# Patient Record
Sex: Female | Born: 1990 | Race: Black or African American | Hispanic: No | Marital: Single | Smoking: Never smoker
Health system: Southern US, Community
[De-identification: ages and names within clinical notes are randomized; demographics above are authoritative.]

## PROBLEM LIST (undated history)

## (undated) DIAGNOSIS — O24419 Gestational diabetes mellitus in pregnancy, unspecified control: Secondary | ICD-10-CM

## (undated) DIAGNOSIS — Z789 Other specified health status: Secondary | ICD-10-CM

## (undated) HISTORY — PX: NO PAST SURGERIES: SHX2092

---

## 2014-05-07 LAB — OB RESULTS CONSOLE GC/CHLAMYDIA
CHLAMYDIA, DNA PROBE: NEGATIVE
Gonorrhea: NEGATIVE

## 2014-06-18 LAB — OB RESULTS CONSOLE ANTIBODY SCREEN: ANTIBODY SCREEN: NEGATIVE

## 2014-06-18 LAB — OB RESULTS CONSOLE ABO/RH: RH Type: POSITIVE

## 2014-06-18 LAB — OB RESULTS CONSOLE RUBELLA ANTIBODY, IGM: RUBELLA: IMMUNE

## 2014-06-18 LAB — OB RESULTS CONSOLE HEPATITIS B SURFACE ANTIGEN: HEP B S AG: NEGATIVE

## 2014-06-18 LAB — OB RESULTS CONSOLE HIV ANTIBODY (ROUTINE TESTING): HIV: NONREACTIVE

## 2014-06-18 LAB — OB RESULTS CONSOLE RPR: RPR: NONREACTIVE

## 2014-11-14 LAB — OB RESULTS CONSOLE GBS: GBS: NEGATIVE

## 2014-12-01 ENCOUNTER — Encounter (HOSPITAL_COMMUNITY): Payer: Self-pay | Admitting: *Deleted

## 2014-12-01 ENCOUNTER — Inpatient Hospital Stay (HOSPITAL_COMMUNITY)
Admission: AD | Admit: 2014-12-01 | Discharge: 2014-12-01 | Disposition: A | Discharge status: Home / Self Care | Payer: Medicaid Other | Source: Ambulatory Visit | Attending: Obstetrics and Gynecology | Admitting: Obstetrics and Gynecology

## 2014-12-01 DIAGNOSIS — O133 Gestational [pregnancy-induced] hypertension without significant proteinuria, third trimester: Secondary | ICD-10-CM | POA: Diagnosis present

## 2014-12-01 DIAGNOSIS — Z3A38 38 weeks gestation of pregnancy: Secondary | ICD-10-CM | POA: Insufficient documentation

## 2014-12-01 DIAGNOSIS — O471 False labor at or after 37 completed weeks of gestation: Secondary | ICD-10-CM | POA: Diagnosis not present

## 2014-12-01 LAB — CBC
HCT: 31.6 % — ABNORMAL LOW (ref 36.0–46.0)
Hemoglobin: 10.5 g/dL — ABNORMAL LOW (ref 12.0–15.0)
MCH: 29 pg (ref 26.0–34.0)
MCHC: 33.2 g/dL (ref 30.0–36.0)
MCV: 87.3 fL (ref 78.0–100.0)
PLATELETS: 236 10*3/uL (ref 150–400)
RBC: 3.62 MIL/uL — AB (ref 3.87–5.11)
RDW: 14.2 % (ref 11.5–15.5)
WBC: 9 10*3/uL (ref 4.0–10.5)

## 2014-12-01 LAB — URINALYSIS, ROUTINE W REFLEX MICROSCOPIC
BILIRUBIN URINE: NEGATIVE
Glucose, UA: NEGATIVE mg/dL
KETONES UR: NEGATIVE mg/dL
NITRITE: NEGATIVE
Protein, ur: NEGATIVE mg/dL
SPECIFIC GRAVITY, URINE: 1.01 (ref 1.005–1.030)
UROBILINOGEN UA: 0.2 mg/dL (ref 0.0–1.0)
pH: 6 (ref 5.0–8.0)

## 2014-12-01 LAB — COMPREHENSIVE METABOLIC PANEL
ALT: 11 U/L — AB (ref 14–54)
ANION GAP: 9 (ref 5–15)
AST: 18 U/L (ref 15–41)
Albumin: 2.9 g/dL — ABNORMAL LOW (ref 3.5–5.0)
Alkaline Phosphatase: 99 U/L (ref 38–126)
BUN: 10 mg/dL (ref 6–20)
CHLORIDE: 106 mmol/L (ref 101–111)
CO2: 20 mmol/L — AB (ref 22–32)
Calcium: 9.5 mg/dL (ref 8.9–10.3)
Creatinine, Ser: 0.9 mg/dL (ref 0.44–1.00)
Glucose, Bld: 81 mg/dL (ref 65–99)
POTASSIUM: 3.7 mmol/L (ref 3.5–5.1)
SODIUM: 135 mmol/L (ref 135–145)
Total Bilirubin: 0.3 mg/dL (ref 0.3–1.2)
Total Protein: 7 g/dL (ref 6.5–8.1)

## 2014-12-01 LAB — PROTEIN / CREATININE RATIO, URINE
CREATININE, URINE: 77 mg/dL
PROTEIN CREATININE RATIO: 0.14 mg/mg{creat} (ref 0.00–0.15)
TOTAL PROTEIN, URINE: 11 mg/dL

## 2014-12-01 LAB — URINE MICROSCOPIC-ADD ON

## 2014-12-01 NOTE — MAU Provider Note (Signed)
Chief Complaint:  Labor Eval  First Provider Initiated Contact with Patient 12/01/14 1120      HPI: Charlene Petty is a 24 y.o. G1P0 at [redacted]w[redacted]d who presents to maternity admissions reporting for term labor check. Elevated BP noted by RN.  No associated HA, vision changes or epigastric pain. leakage of fluid or vaginal bleeding. Good fetal movement.   Pregnancy Course: Uncomplicated except for elevated 1 hour GTT, vomited 3 hour GTT. Checking CBG which are normal. No prior elevated BPs per prenatal record.   Past Medical History: No pertinent positive medical history  Past obstetric history: OB History  Gravida Para Term Preterm AB SAB TAB Ectopic Multiple Living  1             # Outcome Date GA Lbr Len/2nd Weight Sex Delivery Anes PTL Lv  1 Current               Past Surgical History: No GYN surgeries   Family History: Breast cancer in grandmother at age 31  Social History: Social History  Substance Use Topics  . Smoking status: Not on file  . Smokeless tobacco: Not on file  . Alcohol Use: Not on file    Allergies: No Known Allergies  Meds:  No prescriptions prior to admission    I have reviewed patient's Past Medical Hx, Surgical Hx, Family Hx, Social Hx, medications and allergies.   ROS:  Review of Systems  Eyes: Negative for visual disturbance.  Gastrointestinal: Positive for abdominal pain (mild contractions only).  Genitourinary: Negative for vaginal bleeding.       Negative for leaking of fluid.  Neurological: Negative for headaches.    Physical Exam   Patient Vitals for the past 24 hrs:  BP Pulse Resp  12/01/14 1356 134/87 mmHg 92 18  12/01/14 1347 134/87 mmHg 92 -  12/01/14 1330 138/77 mmHg 91 -  12/01/14 1315 118/59 mmHg 99 -  12/01/14 1300 123/88 mmHg 91 -  12/01/14 1245 126/80 mmHg 100 -  12/01/14 1230 121/91 mmHg 96 -  12/01/14 1215 120/70 mmHg 96 -  12/01/14 1200 123/55 mmHg 94 -  12/01/14 1146 131/68 mmHg 94 -  12/01/14 1130 135/87 mmHg  88 -  12/01/14 1116 138/81 mmHg 96 -  12/01/14 1112 146/84 mmHg 92 -  12/01/14 1058 142/84 mmHg 95 -  12/01/14 1053 132/68 mmHg 86 -  12/01/14 0953 140/82 mmHg 95 -   Constitutional: Well-developed, well-nourished female in no acute distress.  Cardiovascular: normal rate Respiratory: normal effort GI: Abd gravid appropriate for gestational age.  MS: Extremities nontender, 1+ edema. Neurologic: Alert and oriented x 4.  ZO:XWRUEAVW: 2.5 Effacement (%): 70 Station: -2 Presentation: Undeterminable Exam by:: Ginger Morris RN  FHT:  Baseline 140 , moderate variability, accelerations present, no decelerations Contractions: Uterine irritability   Labs: Results for orders placed or performed during the hospital encounter of 12/01/14 (from the past 24 hour(s))  Protein / creatinine ratio, urine     Status: None   Collection Time: 12/01/14 11:00 AM  Result Value Ref Range   Creatinine, Urine 77.00 mg/dL   Total Protein, Urine 11 mg/dL   Protein Creatinine Ratio 0.14 0.00 - 0.15 mg/mg[Cre]  Urinalysis, Routine w reflex microscopic (not at [facility])     Status: Abnormal   Collection Time: 12/01/14 11:00 AM  Result Value Ref Range   Color, Urine YELLOW YELLOW   APPearance CLEAR CLEAR   Specific Gravity, Urine 1.010 1.005 - 1.030   pH 6.0 5.0 -  8.0   Glucose, UA NEGATIVE NEGATIVE mg/dL   Hgb urine dipstick TRACE (A) NEGATIVE   Bilirubin Urine NEGATIVE NEGATIVE   Ketones, ur NEGATIVE NEGATIVE mg/dL   Protein, ur NEGATIVE NEGATIVE mg/dL   Urobilinogen, UA 0.2 0.0 - 1.0 mg/dL   Nitrite NEGATIVE NEGATIVE   Leukocytes, UA SMALL (A) NEGATIVE  Urine microscopic-add on     Status: Abnormal   Collection Time: 12/01/14 11:00 AM  Result Value Ref Range   Squamous Epithelial / LPF FEW (A) RARE   WBC, UA 3-6 <3 WBC/hpf   RBC / HPF 0-2 <3 RBC/hpf   Bacteria, UA FEW (A) RARE  CBC     Status: Abnormal   Collection Time: 12/01/14 11:07 AM  Result Value Ref Range   WBC 9.0 4.0 - 10.5 K/uL   RBC  3.62 (L) 3.87 - 5.11 MIL/uL   Hemoglobin 10.5 (L) 12.0 - 15.0 g/dL   HCT 40.931.6 (L) 81.136.0 - 91.446.0 %   MCV 87.3 78.0 - 100.0 fL   MCH 29.0 26.0 - 34.0 pg   MCHC 33.2 30.0 - 36.0 g/dL   RDW 78.214.2 95.611.5 - 21.315.5 %   Platelets 236 150 - 400 K/uL  Comprehensive metabolic panel     Status: Abnormal   Collection Time: 12/01/14 11:07 AM  Result Value Ref Range   Sodium 135 135 - 145 mmol/L   Potassium 3.7 3.5 - 5.1 mmol/L   Chloride 106 101 - 111 mmol/L   CO2 20 (L) 22 - 32 mmol/L   Glucose, Bld 81 65 - 99 mg/dL   BUN 10 6 - 20 mg/dL   Creatinine, Ser 0.860.90 0.44 - 1.00 mg/dL   Calcium 9.5 8.9 - 57.810.3 mg/dL   Total Protein 7.0 6.5 - 8.1 g/dL   Albumin 2.9 (L) 3.5 - 5.0 g/dL   AST 18 15 - 41 U/L   ALT 11 (L) 14 - 54 U/L   Alkaline Phosphatase 99 38 - 126 U/L   Total Bilirubin 0.3 0.3 - 1.2 mg/dL   GFR calc non Af Amer >60 >60 mL/min   GFR calc Af Amer >60 >60 mL/min   Anion gap 9 5 - 15    Imaging:  No results found.  MAU Course: CBC, CMP, UA, protein creatinine ratio, NST, cycle blood pressures.  MDM:  24 year old female 30 weeks 1 day with transient hypertension of pregnancy, but no evidence of preeclampsia.   Assessment: 1. Transient hypertension of pregnancy in third trimester   2. False labor after 37 weeks of gestation without delivery    Plan: Discharge home in stable condition.  Labor precautions and fetal kick counts  preeclampsia precautions.  Follow-up Information    Follow up with Valisa Karpel A, MD.   Specialty:  Obstetrics and Gynecology   Why:  This week as scheduled or sooner as needed if symptoms worsen   Contact information:   [address] [place] [id number] [phone]       [facility].   Why:  As needed for labor or symptoms of preeclampsia   Contact information:   [address] [id number] [place] [id number] [phone]        Medication  List    TAKE these medications        prenatal multivitamin Tabs tablet  Take 1 tablet by mouth daily at 12 noon.       Cabin JohnVirginia Smith, CNM 12/01/2014 5:56 PM

## 2014-12-01 NOTE — Progress Notes (Signed)
Dr Henderson CloudHorvath notified of pt's FHR, contraction pattern, VE, orders received to discharge home

## 2014-12-01 NOTE — MAU Note (Signed)
Patient presents at 5138 weeks gestation with c/o contractions X 3 days. Fetus active. Denies bleeding but has a white discharge.

## 2014-12-01 NOTE — Discharge Instructions (Signed)
Fetal Movement Counts °Patient Name: __________________________________________________ Patient Due Date: ____________________ °Performing a fetal movement count is highly recommended in high-risk pregnancies, but it is good for every pregnant woman to do. Your health care provider may ask you to start counting fetal movements at 28 weeks of the pregnancy. Fetal movements often increase: °· After eating a full meal. °· After physical activity. °· After eating or drinking something sweet or cold. °· At rest. °Pay attention to when you feel the baby is most active. This will help you notice a pattern of your baby's sleep and wake cycles and what factors contribute to an increase in fetal movement. It is important to perform a fetal movement count at the same time each day when your baby is normally most active.  °HOW TO COUNT FETAL MOVEMENTS °· Find a quiet and comfortable area to sit or lie down on your left side. Lying on your left side provides the best blood and oxygen circulation to your baby. °· Write down the day and time on a sheet of paper or in a journal. °· Start counting kicks, flutters, swishes, rolls, or jabs in a 2-hour period. You should feel at least 10 movements within 2 hours. °· If you do not feel 10 movements in 2 hours, wait 2-3 hours and count again. Look for a change in the pattern or not enough counts in 2 hours. °SEEK MEDICAL CARE IF: °· You feel less than 10 counts in 2 hours, tried twice. °· There is no movement in over an hour. °· The pattern is changing or taking longer each day to reach 10 counts in 2 hours. °· You feel the baby is not moving as he or she usually does. °Date: ____________ Movements: ____________ Start time: ____________ Finish time: ____________  °Date: ____________ Movements: ____________ Start time: ____________ Finish time: ____________ °Date: ____________ Movements: ____________ Start time: ____________ Finish time: ____________ °Date: ____________ Movements:  ____________ Start time: ____________ Finish time: ____________ °Date: ____________ Movements: ____________ Start time: ____________ Finish time: ____________ °Date: ____________ Movements: ____________ Start time: ____________ Finish time: ____________ °Date: ____________ Movements: ____________ Start time: ____________ Finish time: ____________ °Date: ____________ Movements: ____________ Start time: ____________ Finish time: ____________  °Date: ____________ Movements: ____________ Start time: ____________ Finish time: ____________ °Date: ____________ Movements: ____________ Start time: ____________ Finish time: ____________ °Date: ____________ Movements: ____________ Start time: ____________ Finish time: ____________ °Date: ____________ Movements: ____________ Start time: ____________ Finish time: ____________ °Date: ____________ Movements: ____________ Start time: ____________ Finish time: ____________ °Date: ____________ Movements: ____________ Start time: ____________ Finish time: ____________ °Date: ____________ Movements: ____________ Start time: ____________ Finish time: ____________  °Date: ____________ Movements: ____________ Start time: ____________ Finish time: ____________ °Date: ____________ Movements: ____________ Start time: ____________ Finish time: ____________ °Date: ____________ Movements: ____________ Start time: ____________ Finish time: ____________ °Date: ____________ Movements: ____________ Start time: ____________ Finish time: ____________ °Date: ____________ Movements: ____________ Start time: ____________ Finish time: ____________ °Date: ____________ Movements: ____________ Start time: ____________ Finish time: ____________ °Date: ____________ Movements: ____________ Start time: ____________ Finish time: ____________  °Date: ____________ Movements: ____________ Start time: ____________ Finish time: ____________ °Date: ____________ Movements: ____________ Start time: ____________ Finish  time: ____________ °Date: ____________ Movements: ____________ Start time: ____________ Finish time: ____________ °Date: ____________ Movements: ____________ Start time: ____________ Finish time: ____________ °Date: ____________ Movements: ____________ Start time: ____________ Finish time: ____________ °Date: ____________ Movements: ____________ Start time: ____________ Finish time: ____________ °Date: ____________ Movements: ____________ Start time: ____________ Finish time: ____________  °Date: ____________ Movements: ____________ Start time: ____________ Finish   time: ____________ Date: ____________ Movements: ____________ Start time: ____________ Doreatha Martin time: ____________ Date: ____________ Movements: ____________ Start time: ____________ Doreatha Martin time: ____________ Date: ____________ Movements: ____________ Start time: ____________ Doreatha Martin time: ____________ Date: ____________ Movements: ____________ Start time: ____________ Doreatha Martin time: ____________ Date: ____________ Movements: ____________ Start time: ____________ Doreatha Martin time: ____________ Date: ____________ Movements: ____________ Start time: ____________ Doreatha Martin time: ____________  Date: ____________ Movements: ____________ Start time: ____________ Doreatha Martin time: ____________ Date: ____________ Movements: ____________ Start time: ____________ Doreatha Martin time: ____________ Date: ____________ Movements: ____________ Start time: ____________ Doreatha Martin time: ____________ Date: ____________ Movements: ____________ Start time: ____________ Doreatha Martin time: ____________ Date: ____________ Movements: ____________ Start time: ____________ Doreatha Martin time: ____________ Date: ____________ Movements: ____________ Start time: ____________ Doreatha Martin time: ____________ Date: ____________ Movements: ____________ Start time: ____________ Doreatha Martin time: ____________  Date: ____________ Movements: ____________ Start time: ____________ Doreatha Martin time: ____________ Date: ____________  Movements: ____________ Start time: ____________ Doreatha Martin time: ____________ Date: ____________ Movements: ____________ Start time: ____________ Doreatha Martin time: ____________ Date: ____________ Movements: ____________ Start time: ____________ Doreatha Martin time: ____________ Date: ____________ Movements: ____________ Start time: ____________ Doreatha Martin time: ____________ Date: ____________ Movements: ____________ Start time: ____________ Doreatha Martin time: ____________ Date: ____________ Movements: ____________ Start time: ____________ Doreatha Martin time: ____________  Date: ____________ Movements: ____________ Start time: ____________ Doreatha Martin time: ____________ Date: ____________ Movements: ____________ Start time: ____________ Doreatha Martin time: ____________ Date: ____________ Movements: ____________ Start time: ____________ Doreatha Martin time: ____________ Date: ____________ Movements: ____________ Start time: ____________ Doreatha Martin time: ____________ Date: ____________ Movements: ____________ Start time: ____________ Doreatha Martin time: ____________ Date: ____________ Movements: ____________ Start time: ____________ Doreatha Martin time: ____________   This information is not intended to replace advice given to you by your health care provider. Make sure you discuss any questions you have with your health care provider.   Document Released: 02/10/2006 Document Revised: 02/01/2014 Document Reviewed: 11/08/2011 Elsevier Interactive Patient Education 2016 [employer]. Parto vaginal (Vaginal Delivery) Durante el parto, el mdico la ayudar a dar a luz a su beb. En elparto vaginal, deber pujar para que el beb salga por la vagina. Sin embargo, antes de que pueda sacar al beb, es necesario que ocurran ciertas cosas. La abertura del tero (cuello del tero) tiene que ablandarse, hacerse ms delgado y abrirse (dilatar) hasta que llegue a 10 cm. Adems, el beb tiene que bajar desde el tero a la vagina. SIGNOS DE TRABAJO DE PARTO  El mdico tendr primero  que asegurarse de que usted est en [place]. Algunos signos son:   Eliminar lo que se llama tapn mucoso antes del inicio del trabajo de [place]. Este es una pequea cantidad de mucosidad teida con sangre.  Tener contracciones uterinas regulares y dolorosas.   El [employer] las contracciones debe acortarse  Las molestias y Chief Technology Officer se harn ms intensos gradualmente.  El dolor de las contracciones empeora al caminar y no se alivia con el reposo.   El cuello del tero se hace mas delgado (se borra) y se dilata. ANTES DEL PARTO Una vez que se inicie el trabajo de parto y sea admitida en el hospital o sanatorio, el mdico podr hacer lo siguiente:   Education officer, environmental un examen fsico.  Controlar si hay complicaciones relacionadas con [place] de parto.  Verificar su presin arterial, temperatura y pulso y la frecuencia cardaca (signos vitales).   Determinar si se ha roto el saco amnitico y cundo ha ocurrido.  Realizar un examen vaginal (utilizando un guante estril y un lubricante) para determinar:  La posicin (presentacin) del beb. El beb se presenta con la  cabeza primero (vertex) en el canal de parto (vagina), o estn los pies o las nalgas primero (de nalgas)?  El nivel (estacin) de la cabeza del beb dentro del canal de parto.  El borramiento y la dilatacin del cuello uterino  El monitor fetal electrnico generalmente se coloca sobre el abdomen al Environmental health practitioner. Se utiliza para controlar las contracciones y la frecuencia cardaca del beb.  Cuando el monitor est en el abdomen (monitor fetal externo), slo toma la frecuencia y la duracin de las contracciones. No informa acerca de la intensidad de las contracciones.  Si el mdico necesita saber exactamente la intensidad de las contracciones o cul es la frecuencia cardaca del beb, colocar un monitor interno en la vagina y [place]. El mdico [employer] riesgos y los beneficios de usar un monitor interno y le  pedir autorizacin antes de Scientist, product/process development dispositivo.  El monitoreo fetal continuo ser necesario si le han aplicado una epidural, si le administran ciertos medicamentos (como oxitocina) y si tiene complicaciones del [place] o del trabajo de [place].  Podrn colocarle una va intravenosa en una vena del brazo para suministrarle lquidos y medicamentos, si es necesario. TRES ETAPAS DEL TRABAJO DE PARTO Y EL PARTO El [place] de parto y el parto normales se dividen en tres etapas. Primera etapa Esta etapa comienza cuando comienzan las contracciones regulares y el cuello comienza a borrarse y dilatarse. Finaliza cuando el cuello est completamente abierto (completamente dilatado). La primera etapa es la etapa ms larga del [place] de parto y puede durar desde 3 horas a 15 horas.  Algunos mtodos estn disponibles para ayudar con el dolor del [place]. Usted y su mdico decidirn qu opcin es la mejor para usted. Las opciones incluyen:   Medicamentos narcticos. Estos son medicamentos fuertes que usted puede recibir a Games developer de una va intravenosa o como inyeccin en el msculo. Estos medicamentos Associate Professor pero no hacen que desaparezca completamente.  Epidural. Se administra un medicamento a travs de un tubo delgado que se inserta en la espalda. El medicamento adormece la parte inferior del cuerpo y evita el dolor en esa zona.  Bloqueo paracervical Es una inyeccin de un anestsico en cada lado del cuello uterino.  Usted podr pedir un parto natural, que implica que no se usen analgsicos ni epidural durante el parto y [place]. En cambio, podr tener otro tipo de ayuda como ejercicios respiratorios para hacer frente al [employer] Segunda etapa La segunda etapa del trabajo de parto comienza cuando el cuello se ha dilatado completamente a 10 cm. Contina hasta que usted puja al beb hacia abajo, por el canal de [place], y el beb nace. Esta etapa puede durar slo algunos minutos o algunas  horas.  La posicin del la [place] del beb a medida que pasa por el canal de parto, es informada como un nmero, llamado estacin. Si la cabeza del beb no ha iniciado su descenso, la estacin se describe como que est en menos 3 (-3). Cuando la cabeza del beb est en la estacin cero, est en el medio del canal de parto y se encaja en la pelvis. La estacin en la que se encuentra el beb indica el progreso de la segunda etapa del [place] de [place].  Cuando el beb nace, el mdico lo sostendr con la cabeza hacia abajo para evitar que el lquido amnitico, el moco y la sangre entren en los pulmones del beb. La boca y la nariz del beb podrn ser succionadas con  un pequeo bulbo para retirar todo lquido adicional.  El mdico podr colocar al beb sobre su estmago. Es importante evitar que el beb tome fro. Para hacerlo, el mdico secar al beb, lo colocar directamente sobre su piel, (sin mantas entre usted y el beb) y lo cubrir con mantas secas y tibias.  Se corta el cordn umbilical. Tercera etapa Durante la tercera etapa del trabajo de parto, el mdico sacar la placenta (alumbramiento) y se asegurar de que el sangrado est controlado. La salida de la placenta generalmente demora 5 minutos pero puede tardar hasta 30 minutos. Luego de la salida de la placenta, le darn un medicamento por va intravenosa o inyectable para ayudar a Engineer, manufacturingcontraer el tero y Air traffic controllercontrolar el sangrado. Si planea amamantar al beb, puede intentar en este momento Luego de la salida de la placenta, el tero debe contraerse y [place] Summitquedar muy firme. Si el tero no queda firme, el mdico lo Engineer, maintenance (IT)masajear. Esto es importante debido a que la contraccin del tero ayuda a Location managercortar el sangrado en el sitio en que la placenta estaba unida al tero. Si el tero no se contrae adecuadamente ni Terex Corporationpermanece firme, podr causar un sangrado abundante. Si hay mucho sangrado, podrn darle medicamentos para contraer el tero y Therapist, musicdetener el sangrado.    Esta  informacin no tiene Theme park managercomo fin reemplazar el consejo del mdico. Asegrese de hacerle al mdico cualquier pregunta que tenga.   Document Released: 12/25/2007 Document Revised: 02/01/2014 Elsevier Interactive Patient Education [employer]2016 Elsevier Inc. Vaginal Delivery During delivery, your health care provider will help you give birth to your baby. During a vaginal delivery, you will work to push the baby out of your vagina. However, before you can push your baby out, a few things need to happen. The opening of your uterus (cervix) has to soften, thin out, and open up (dilate) all the way to 10 cm. Also, your baby has to move down from the uterus into your vagina.  SIGNS OF LABOR  Your health care provider will first need to make sure you are in labor. Signs of labor include:   Passing what is called the mucous plug before labor begins. This is a small amount of blood-stained mucus.  Having regular, painful uterine contractions.   The time between contractions gets shorter.   The discomfort and pain gradually get more intense.  Contraction pains get worse when walking and do not go away when resting.   Your cervix becomes thinner (effacement) and dilates. BEFORE THE DELIVERY Once you are in labor and admitted into the hospital or care center, your health care provider may do the following:   Perform a complete physical exam.  Review any complications related to pregnancy or labor.  Check your blood pressure, pulse, temperature, and heart rate (vital signs).   Determine if, and when, the rupture of amniotic membranes occurred.  Do a vaginal exam (using a sterile glove and lubricant) to determine:   The position (presentation) of the baby. Is the baby's head presenting first (vertex) in the birth canal (vagina), or are the feet or buttocks first (breech)?   The level (station) of the baby's head within the birth canal.   The effacement and dilatation of the cervix.   An  electronic fetal monitor is usually placed on your abdomen when you first arrive. This is used to monitor your contractions and the baby's heart rate.  When the monitor is on your abdomen (external fetal monitor), it can only pick up the frequency and  length of your contractions. It cannot tell the strength of your contractions.  If it becomes necessary for your health care provider to know exactly how strong your contractions are or to see exactly what the baby's heart rate is doing, an internal monitor may be inserted into your vagina and uterus. Your health care provider will discuss the benefits and risks of using an internal monitor and obtain your permission before inserting the device.  Continuous fetal monitoring may be needed if you have an epidural, are receiving certain medicines (such as oxytocin), or have pregnancy or labor complications.  An IV access tube may be placed into a vein in your arm to deliver fluids and medicines if necessary. THREE STAGES OF LABOR AND DELIVERY Normal labor and delivery is divided into three stages. First Stage This stage starts when you begin to contract regularly and your cervix begins to efface and dilate. It ends when your cervix is completely open (fully dilated). The first stage is the longest stage of labor and can last from 3 hours to 15 hours.  Several methods are available to help with labor pain. You and your health care provider will decide which option is best for you. Options include:   Opioid medicines. These are strong pain medicines that you can get through your IV tube or as a shot into your muscle. These medicines lessen pain but do not make it go away completely.  Epidural. A medicine is given through a thin tube that is inserted in your back. The medicine numbs the lower part of your body and prevents any pain in that area.  Paracervical pain medicine. This is an injection of an anesthetic on each side of your cervix.   You may  request natural childbirth, which does not involve the use of pain medicines or an epidural during labor and delivery. Instead, you will use other things, such as breathing exercises, to help cope with the pain. Second Stage The second stage of labor begins when your cervix is fully dilated at 10 cm. It continues until you push your baby down through the birth canal and the baby is born. This stage can take only minutes or several hours.  The location of your baby's head as it moves through the birth canal is reported as a number called a station. If the baby's head has not started its descent, the station is described as being at minus 3 (-3). When your baby's head is at the zero station, it is at the middle of the birth canal and is engaged in the pelvis. The station of your baby helps indicate the progress of the second stage of labor.  When your baby is born, your health care provider may hold the baby with his or her head lowered to prevent amniotic fluid, mucus, and blood from getting into the baby's lungs. The baby's mouth and nose may be suctioned with a small bulb syringe to remove any additional fluid.  Your health care provider may then place the baby on your stomach. It is important to keep the baby from getting cold. To do this, the health care provider will dry the baby off, place the baby directly on your skin (with no blankets between you and the baby), and cover the baby with warm, dry blankets.   The umbilical cord is cut. Third Stage During the third stage of labor, your health care provider will deliver the placenta (afterbirth) and make sure your bleeding is under control. The delivery  of the placenta usually takes about 5 minutes but can take up to 30 minutes. After the placenta is delivered, a medicine may be given either by IV or injection to help contract the uterus and control bleeding. If you are planning to breastfeed, you can try to do so now. After you deliver the  placenta, your uterus should contract and get very firm. If your uterus does not remain firm, your health care provider will massage it. This is important because the contraction of the uterus helps cut off bleeding at the site where the placenta was attached to your uterus. If your uterus does not contract properly and stay firm, you may continue to bleed heavily. If there is a lot of bleeding, medicines may be given to contract the uterus and stop the bleeding.    This information is not intended to replace advice given to you by your health care provider. Make sure you discuss any questions you have with your health care provider.   Document Released: 10/21/2007 Document Revised: 02/01/2014 Document Reviewed: 09/08/2011 Elsevier Interactive Patient Education [employer]

## 2014-12-01 NOTE — Progress Notes (Signed)
Dr Henderson CloudHorvath notified of pt's lab results orders received for pt to follow up in office this week in 1 to 2 days. Discharge home

## 2014-12-07 ENCOUNTER — Inpatient Hospital Stay (HOSPITAL_COMMUNITY): Payer: Medicaid Other

## 2014-12-07 ENCOUNTER — Inpatient Hospital Stay (HOSPITAL_COMMUNITY): Payer: Medicaid Other | Admitting: Anesthesiology

## 2014-12-07 ENCOUNTER — Encounter (HOSPITAL_COMMUNITY): Payer: Self-pay | Admitting: *Deleted

## 2014-12-07 ENCOUNTER — Encounter (HOSPITAL_COMMUNITY)
Admission: AD | Disposition: A | Discharge status: Home / Self Care | Payer: Self-pay | Source: Ambulatory Visit | Attending: Obstetrics and Gynecology

## 2014-12-07 ENCOUNTER — Inpatient Hospital Stay (HOSPITAL_COMMUNITY)
Admission: AD | Admit: 2014-12-07 | Discharge: 2014-12-10 | DRG: 766 | Disposition: A | Discharge status: Home / Self Care | Payer: Medicaid Other | Source: Ambulatory Visit | Attending: Obstetrics and Gynecology | Admitting: Obstetrics and Gynecology

## 2014-12-07 DIAGNOSIS — IMO0001 Reserved for inherently not codable concepts without codable children: Secondary | ICD-10-CM

## 2014-12-07 DIAGNOSIS — Z3A39 39 weeks gestation of pregnancy: Secondary | ICD-10-CM

## 2014-12-07 DIAGNOSIS — Z6839 Body mass index (BMI) 39.0-39.9, adult: Secondary | ICD-10-CM

## 2014-12-07 DIAGNOSIS — O99214 Obesity complicating childbirth: Secondary | ICD-10-CM | POA: Diagnosis present

## 2014-12-07 DIAGNOSIS — Z09 Encounter for follow-up examination after completed treatment for conditions other than malignant neoplasm: Secondary | ICD-10-CM

## 2014-12-07 HISTORY — DX: Other specified health status: Z78.9

## 2014-12-07 LAB — CBC
HEMATOCRIT: 32.9 % — AB (ref 36.0–46.0)
Hemoglobin: 10.8 g/dL — ABNORMAL LOW (ref 12.0–15.0)
MCH: 28.8 pg (ref 26.0–34.0)
MCHC: 32.8 g/dL (ref 30.0–36.0)
MCV: 87.7 fL (ref 78.0–100.0)
Platelets: 263 10*3/uL (ref 150–400)
RBC: 3.75 MIL/uL — ABNORMAL LOW (ref 3.87–5.11)
RDW: 14.5 % (ref 11.5–15.5)
WBC: 9.3 10*3/uL (ref 4.0–10.5)

## 2014-12-07 LAB — TYPE AND SCREEN
ABO/RH(D): B POS
ANTIBODY SCREEN: NEGATIVE

## 2014-12-07 LAB — ABO/RH: ABO/RH(D): B POS

## 2014-12-07 SURGERY — Surgical Case
Anesthesia: Epidural

## 2014-12-07 MED ORDER — MORPHINE SULFATE (PF) 0.5 MG/ML IJ SOLN
INTRAMUSCULAR | Status: AC
  Filled 2014-12-07: qty 100

## 2014-12-07 MED ORDER — FENTANYL 2.5 MCG/ML BUPIVACAINE 1/10 % EPIDURAL INFUSION (WH - ANES)
14.0000 mL/h | INTRAMUSCULAR | Status: DC | PRN
Start: 2014-12-07 — End: 2014-12-07
  Administered 2014-12-07: 12 mL/h via EPIDURAL
  Filled 2014-12-07: qty 125

## 2014-12-07 MED ORDER — DEXAMETHASONE SODIUM PHOSPHATE 4 MG/ML IJ SOLN
INTRAMUSCULAR | Status: DC | PRN
  Administered 2014-12-07: 4 mg via INTRAVENOUS

## 2014-12-07 MED ORDER — ONDANSETRON HCL 4 MG/2ML IJ SOLN
INTRAMUSCULAR | Status: DC | PRN
  Administered 2014-12-07: 4 mg via INTRAVENOUS

## 2014-12-07 MED ORDER — CITRIC ACID-SODIUM CITRATE 334-500 MG/5ML PO SOLN
30.0000 mL | ORAL | Status: DC | PRN
  Administered 2014-12-07: 30 mL via ORAL
  Filled 2014-12-07: qty 15

## 2014-12-07 MED ORDER — MENTHOL 3 MG MT LOZG: 1.0000 | LOZENGE | OROMUCOSAL | Status: DC | PRN

## 2014-12-07 MED ORDER — DIPHENHYDRAMINE HCL 25 MG PO CAPS: 25.0000 mg | ORAL_CAPSULE | ORAL | Status: DC | PRN

## 2014-12-07 MED ORDER — OXYTOCIN BOLUS FROM INFUSION: 500.0000 mL | INTRAVENOUS | Status: DC

## 2014-12-07 MED ORDER — PRENATAL MULTIVITAMIN CH
1.0000 | ORAL_TABLET | Freq: Every day | ORAL | Status: DC
Start: 2014-12-08 — End: 2014-12-10
  Administered 2014-12-08 – 2014-12-10 (×3): 1 via ORAL
  Filled 2014-12-07 (×3): qty 1

## 2014-12-07 MED ORDER — INFLUENZA VAC SPLIT QUAD 0.5 ML IM SUSY
0.5000 mL | PREFILLED_SYRINGE | INTRAMUSCULAR | Status: AC
  Administered 2014-12-08: 0.5 mL via INTRAMUSCULAR
  Filled 2014-12-07: qty 0.5

## 2014-12-07 MED ORDER — LACTATED RINGERS IV SOLN
INTRAVENOUS | Status: DC | PRN
  Administered 2014-12-07: 13:00:00 via INTRAVENOUS

## 2014-12-07 MED ORDER — EPHEDRINE 5 MG/ML INJ: 10.0000 mg | INTRAVENOUS | Status: DC | PRN

## 2014-12-07 MED ORDER — CEFAZOLIN SODIUM-DEXTROSE 2-3 GM-% IV SOLR
INTRAVENOUS | Status: DC | PRN
  Administered 2014-12-07: 2 g via INTRAVENOUS

## 2014-12-07 MED ORDER — FENTANYL CITRATE (PF) 100 MCG/2ML IJ SOLN
INTRAMUSCULAR | Status: AC
  Filled 2014-12-07: qty 4

## 2014-12-07 MED ORDER — SENNOSIDES-DOCUSATE SODIUM 8.6-50 MG PO TABS
2.0000 | ORAL_TABLET | ORAL | Status: DC
Start: 2014-12-08 — End: 2014-12-10
  Administered 2014-12-07 – 2014-12-09 (×3): 2 via ORAL
  Filled 2014-12-07 (×3): qty 2

## 2014-12-07 MED ORDER — NALBUPHINE HCL 10 MG/ML IJ SOLN: 5.0000 mg | INTRAMUSCULAR | Status: DC | PRN

## 2014-12-07 MED ORDER — MEPERIDINE HCL 25 MG/ML IJ SOLN: 6.2500 mg | INTRAMUSCULAR | Status: DC | PRN

## 2014-12-07 MED ORDER — OXYCODONE-ACETAMINOPHEN 5-325 MG PO TABS: 2.0000 | ORAL_TABLET | ORAL | Status: DC | PRN

## 2014-12-07 MED ORDER — DEXAMETHASONE SODIUM PHOSPHATE 4 MG/ML IJ SOLN
INTRAMUSCULAR | Status: AC
  Filled 2014-12-07: qty 1

## 2014-12-07 MED ORDER — IBUPROFEN 600 MG PO TABS
600.0000 mg | ORAL_TABLET | Freq: Four times a day (QID) | ORAL | Status: DC
  Administered 2014-12-07 – 2014-12-10 (×11): 600 mg via ORAL
  Filled 2014-12-07 (×11): qty 1

## 2014-12-07 MED ORDER — DIBUCAINE 1 % RE OINT: 1.0000 "application " | TOPICAL_OINTMENT | RECTAL | Status: DC | PRN

## 2014-12-07 MED ORDER — LACTATED RINGERS IV SOLN
INTRAVENOUS | Status: DC
  Administered 2014-12-07 (×2): via INTRAVENOUS

## 2014-12-07 MED ORDER — SODIUM CHLORIDE 0.9 % IJ SOLN: 3.0000 mL | INTRAMUSCULAR | Status: DC | PRN

## 2014-12-07 MED ORDER — NALOXONE HCL 2 MG/2ML IJ SOSY
1.0000 ug/kg/h | PREFILLED_SYRINGE | INTRAVENOUS | Status: DC | PRN
  Filled 2014-12-07: qty 2

## 2014-12-07 MED ORDER — LANOLIN HYDROUS EX OINT: 1.0000 "application " | TOPICAL_OINTMENT | CUTANEOUS | Status: DC | PRN

## 2014-12-07 MED ORDER — TERBUTALINE SULFATE 1 MG/ML IJ SOLN
INTRAMUSCULAR | Status: AC
  Administered 2014-12-07: 1 mg
  Filled 2014-12-07: qty 1

## 2014-12-07 MED ORDER — MORPHINE SULFATE (PF) 0.5 MG/ML IJ SOLN
INTRAMUSCULAR | Status: DC | PRN
  Administered 2014-12-07 (×2): 2 mg via EPIDURAL

## 2014-12-07 MED ORDER — LACTATED RINGERS IV SOLN
500.0000 mL | INTRAVENOUS | Status: DC | PRN
  Administered 2014-12-07: 1000 mL via INTRAVENOUS
  Administered 2014-12-07: 500 mL via INTRAVENOUS

## 2014-12-07 MED ORDER — NALOXONE HCL 0.4 MG/ML IJ SOLN: 0.4000 mg | INTRAMUSCULAR | Status: DC | PRN

## 2014-12-07 MED ORDER — TERBUTALINE SULFATE 1 MG/ML IJ SOLN
0.2500 mg | Freq: Once | INTRAMUSCULAR | Status: AC
  Administered 2014-12-07: 0.25 mg via SUBCUTANEOUS

## 2014-12-07 MED ORDER — LACTATED RINGERS IV SOLN
INTRAVENOUS | Status: DC | PRN
  Administered 2014-12-07 (×2): via INTRAVENOUS

## 2014-12-07 MED ORDER — PHENYLEPHRINE HCL 10 MG/ML IJ SOLN
INTRAMUSCULAR | Status: DC | PRN
  Administered 2014-12-07 (×2): 40 ug via INTRAVENOUS

## 2014-12-07 MED ORDER — SIMETHICONE 80 MG PO CHEW
80.0000 mg | CHEWABLE_TABLET | ORAL | Status: DC
Start: 2014-12-08 — End: 2014-12-10
  Administered 2014-12-07 – 2014-12-09 (×3): 80 mg via ORAL
  Filled 2014-12-07 (×3): qty 1

## 2014-12-07 MED ORDER — DIPHENHYDRAMINE HCL 25 MG PO CAPS: 25.0000 mg | ORAL_CAPSULE | Freq: Four times a day (QID) | ORAL | Status: DC | PRN

## 2014-12-07 MED ORDER — ONDANSETRON HCL 4 MG/2ML IJ SOLN
INTRAMUSCULAR | Status: AC
  Filled 2014-12-07: qty 2

## 2014-12-07 MED ORDER — LIDOCAINE HCL (PF) 1 % IJ SOLN: 30.0000 mL | INTRAMUSCULAR | Status: DC | PRN

## 2014-12-07 MED ORDER — DIPHENHYDRAMINE HCL 50 MG/ML IJ SOLN: 12.5000 mg | INTRAMUSCULAR | Status: DC | PRN

## 2014-12-07 MED ORDER — OXYCODONE-ACETAMINOPHEN 5-325 MG PO TABS: 1.0000 | ORAL_TABLET | ORAL | Status: DC | PRN

## 2014-12-07 MED ORDER — OXYTOCIN 10 UNIT/ML IJ SOLN
INTRAMUSCULAR | Status: AC
  Filled 2014-12-07: qty 4

## 2014-12-07 MED ORDER — OXYTOCIN 40 UNITS IN LACTATED RINGERS INFUSION - SIMPLE MED: 62.5000 mL/h | INTRAVENOUS | Status: AC

## 2014-12-07 MED ORDER — KETOROLAC TROMETHAMINE 30 MG/ML IJ SOLN
INTRAMUSCULAR | Status: AC
  Administered 2014-12-07: 30 mg via INTRAMUSCULAR
  Filled 2014-12-07: qty 1

## 2014-12-07 MED ORDER — ONDANSETRON HCL 4 MG/2ML IJ SOLN
4.0000 mg | Freq: Three times a day (TID) | INTRAMUSCULAR | Status: DC | PRN
Start: 2014-12-07 — End: 2014-12-10

## 2014-12-07 MED ORDER — KETOROLAC TROMETHAMINE 30 MG/ML IJ SOLN: 30.0000 mg | Freq: Four times a day (QID) | INTRAMUSCULAR | Status: AC | PRN

## 2014-12-07 MED ORDER — CEFAZOLIN SODIUM-DEXTROSE 2-3 GM-% IV SOLR
INTRAVENOUS | Status: AC
  Filled 2014-12-07: qty 50

## 2014-12-07 MED ORDER — TETANUS-DIPHTH-ACELL PERTUSSIS 5-2.5-18.5 LF-MCG/0.5 IM SUSP: 0.5000 mL | Freq: Once | INTRAMUSCULAR | Status: DC

## 2014-12-07 MED ORDER — SIMETHICONE 80 MG PO CHEW: 80.0000 mg | CHEWABLE_TABLET | ORAL | Status: DC | PRN

## 2014-12-07 MED ORDER — SCOPOLAMINE 1 MG/3DAYS TD PT72
MEDICATED_PATCH | TRANSDERMAL | Status: DC | PRN
  Administered 2014-12-07: 1 via TRANSDERMAL

## 2014-12-07 MED ORDER — TERBUTALINE SULFATE 1 MG/ML IJ SOLN
INTRAMUSCULAR | Status: AC
  Filled 2014-12-07: qty 1

## 2014-12-07 MED ORDER — LIDOCAINE-EPINEPHRINE (PF) 2 %-1:200000 IJ SOLN
INTRAMUSCULAR | Status: AC
  Filled 2014-12-07: qty 20

## 2014-12-07 MED ORDER — KETOROLAC TROMETHAMINE 30 MG/ML IJ SOLN
30.0000 mg | Freq: Four times a day (QID) | INTRAMUSCULAR | Status: AC | PRN
  Administered 2014-12-07: 30 mg via INTRAMUSCULAR

## 2014-12-07 MED ORDER — SODIUM BICARBONATE 8.4 % IV SOLN
INTRAVENOUS | Status: AC
  Filled 2014-12-07: qty 50

## 2014-12-07 MED ORDER — FENTANYL CITRATE (PF) 100 MCG/2ML IJ SOLN: 25.0000 ug | INTRAMUSCULAR | Status: DC | PRN

## 2014-12-07 MED ORDER — SIMETHICONE 80 MG PO CHEW
80.0000 mg | CHEWABLE_TABLET | Freq: Three times a day (TID) | ORAL | Status: DC
  Administered 2014-12-08 – 2014-12-10 (×8): 80 mg via ORAL
  Filled 2014-12-07 (×8): qty 1

## 2014-12-07 MED ORDER — SODIUM BICARBONATE 8.4 % IV SOLN
INTRAVENOUS | Status: DC | PRN
  Administered 2014-12-07 (×3): 5 mL via EPIDURAL

## 2014-12-07 MED ORDER — ZOLPIDEM TARTRATE 5 MG PO TABS: 5.0000 mg | ORAL_TABLET | Freq: Every evening | ORAL | Status: DC | PRN

## 2014-12-07 MED ORDER — NALBUPHINE HCL 10 MG/ML IJ SOLN: 5.0000 mg | Freq: Once | INTRAMUSCULAR | Status: DC | PRN

## 2014-12-07 MED ORDER — LIDOCAINE HCL (PF) 1 % IJ SOLN
INTRAMUSCULAR | Status: DC | PRN
  Administered 2014-12-07 (×2): 4 mL

## 2014-12-07 MED ORDER — LACTATED RINGERS IV SOLN
INTRAVENOUS | Status: DC
  Administered 2014-12-07: 21:00:00 via INTRAVENOUS

## 2014-12-07 MED ORDER — OXYTOCIN 10 UNIT/ML IJ SOLN
40.0000 [IU] | INTRAVENOUS | Status: DC | PRN
  Administered 2014-12-07: 40 [IU] via INTRAVENOUS

## 2014-12-07 MED ORDER — FENTANYL CITRATE (PF) 100 MCG/2ML IJ SOLN
INTRAMUSCULAR | Status: DC | PRN
  Administered 2014-12-07: 100 ug via EPIDURAL

## 2014-12-07 MED ORDER — PHENYLEPHRINE 40 MCG/ML (10ML) SYRINGE FOR IV PUSH (FOR BLOOD PRESSURE SUPPORT)
80.0000 ug | PREFILLED_SYRINGE | INTRAVENOUS | Status: DC | PRN
  Filled 2014-12-07: qty 20

## 2014-12-07 MED ORDER — FLEET ENEMA 7-19 GM/118ML RE ENEM: 1.0000 | ENEMA | RECTAL | Status: DC | PRN

## 2014-12-07 MED ORDER — ACETAMINOPHEN 325 MG PO TABS: 650.0000 mg | ORAL_TABLET | ORAL | Status: DC | PRN

## 2014-12-07 MED ORDER — SCOPOLAMINE 1 MG/3DAYS TD PT72
1.0000 | MEDICATED_PATCH | Freq: Once | TRANSDERMAL | Status: DC
  Filled 2014-12-07: qty 1

## 2014-12-07 MED ORDER — BUPIVACAINE LIPOSOME 1.3 % IJ SUSP
20.0000 mL | Freq: Once | INTRAMUSCULAR | Status: DC
Start: 2014-12-07 — End: 2014-12-07
  Filled 2014-12-07: qty 20

## 2014-12-07 MED ORDER — LACTATED RINGERS IV SOLN: INTRAVENOUS | Status: DC

## 2014-12-07 MED ORDER — ONDANSETRON HCL 4 MG/2ML IJ SOLN: 4.0000 mg | Freq: Four times a day (QID) | INTRAMUSCULAR | Status: DC | PRN

## 2014-12-07 MED ORDER — OXYCODONE-ACETAMINOPHEN 5-325 MG PO TABS
1.0000 | ORAL_TABLET | ORAL | Status: DC | PRN
  Administered 2014-12-08 – 2014-12-10 (×7): 1 via ORAL
  Filled 2014-12-07 (×7): qty 1

## 2014-12-07 MED ORDER — OXYCODONE-ACETAMINOPHEN 5-325 MG PO TABS
2.0000 | ORAL_TABLET | ORAL | Status: DC | PRN
  Administered 2014-12-09: 2 via ORAL
  Filled 2014-12-07: qty 2

## 2014-12-07 MED ORDER — WITCH HAZEL-GLYCERIN EX PADS: 1.0000 "application " | MEDICATED_PAD | CUTANEOUS | Status: DC | PRN

## 2014-12-07 MED ORDER — ACETAMINOPHEN 500 MG PO TABS
1000.0000 mg | ORAL_TABLET | Freq: Four times a day (QID) | ORAL | Status: AC
  Administered 2014-12-07 – 2014-12-08 (×4): 1000 mg via ORAL
  Filled 2014-12-07 (×4): qty 2

## 2014-12-07 MED ORDER — ONDANSETRON HCL 4 MG/2ML IJ SOLN: 4.0000 mg | Freq: Once | INTRAMUSCULAR | Status: DC | PRN

## 2014-12-07 MED ORDER — OXYTOCIN 40 UNITS IN LACTATED RINGERS INFUSION - SIMPLE MED: 62.5000 mL/h | INTRAVENOUS | Status: DC

## 2014-12-07 SURGICAL SUPPLY — 33 items
CLAMP CORD UMBIL (MISCELLANEOUS) IMPLANT
CLOTH BEACON ORANGE TIMEOUT ST (SAFETY) ×3 IMPLANT
DRAPE SHEET LG 3/4 BI-LAMINATE (DRAPES) IMPLANT
DRSG OPSITE POSTOP 4X10 (GAUZE/BANDAGES/DRESSINGS) ×3 IMPLANT
DURAPREP 26ML APPLICATOR (WOUND CARE) ×3 IMPLANT
ELECT REM PT RETURN 9FT ADLT (ELECTROSURGICAL) ×3
ELECTRODE REM PT RTRN 9FT ADLT (ELECTROSURGICAL) ×1 IMPLANT
EXTRACTOR VACUUM M CUP 4 TUBE (SUCTIONS) IMPLANT
EXTRACTOR VACUUM M CUP 4' TUBE (SUCTIONS)
GLOVE BIO SURGEON STRL SZ7 (GLOVE) ×3 IMPLANT
GLOVE BIOGEL PI IND STRL 7.0 (GLOVE) ×1 IMPLANT
GLOVE BIOGEL PI INDICATOR 7.0 (GLOVE) ×2
GOWN STRL REUS W/TWL LRG LVL3 (GOWN DISPOSABLE) ×6 IMPLANT
KIT ABG SYR 3ML LUER SLIP (SYRINGE) IMPLANT
LIQUID BAND (GAUZE/BANDAGES/DRESSINGS) ×3 IMPLANT
NEEDLE HYPO 22GX1.5 SAFETY (NEEDLE) IMPLANT
NEEDLE HYPO 25X5/8 SAFETYGLIDE (NEEDLE) IMPLANT
NS IRRIG 1000ML POUR BTL (IV SOLUTION) ×3 IMPLANT
PACK C SECTION WH (CUSTOM PROCEDURE TRAY) ×3 IMPLANT
PAD OB MATERNITY 4.3X12.25 (PERSONAL CARE ITEMS) ×3 IMPLANT
PENCIL SMOKE EVAC W/HOLSTER (ELECTROSURGICAL) ×3 IMPLANT
RETRACTOR WND ALEXIS 25 LRG (MISCELLANEOUS) ×1 IMPLANT
RTRCTR C-SECT PINK 25CM LRG (MISCELLANEOUS) ×3 IMPLANT
RTRCTR WOUND ALEXIS 25CM LRG (MISCELLANEOUS) ×3
SUT CHROMIC 1 CTX 36 (SUTURE) ×12 IMPLANT
SUT CHROMIC 2 0 CT 1 (SUTURE) ×3 IMPLANT
SUT PDS AB 0 CTX 60 (SUTURE) ×3 IMPLANT
SUT VIC AB 2-0 CT1 27 (SUTURE) ×2
SUT VIC AB 2-0 CT1 TAPERPNT 27 (SUTURE) ×1 IMPLANT
SUT VIC AB 4-0 KS 27 (SUTURE) ×6 IMPLANT
SYR 30ML LL (SYRINGE) IMPLANT
TOWEL OR 17X24 6PK STRL BLUE (TOWEL DISPOSABLE) ×3 IMPLANT
TRAY FOLEY CATH SILVER 14FR (SET/KITS/TRAYS/PACK) ×3 IMPLANT

## 2014-12-07 NOTE — H&P (Signed)
Charlene Petty is a 24 y.o. female presenting for painful contractions  24 yo G1P0 @ 39+0 presents to [facility] with painful contractions and was found to be 5-6 cm dilated. Patient failed her 1 hr and vomited half of her 3 hr gtt. She had been monitoring her blood sugars.  History OB History    Gravida Para Term Preterm AB TAB SAB Ectopic Multiple Living   1              Past Medical History  Diagnosis Date  . Medical history non-contributory    Past Surgical History  Procedure Laterality Date  . No past surgeries     Family History: family history is not on file. Social History:  reports that she has never smoked. She has never used smokeless tobacco. She reports that she does not drink alcohol or use illicit drugs.   Prenatal Transfer Tool  Maternal Diabetes: No Genetic Screening: Declined Maternal Ultrasounds/Referrals: Normal Fetal Ultrasounds or other Referrals:  None Maternal Substance Abuse:  No Significant Maternal Medications:  None Significant Maternal Lab Results:  None Other Comments:  None  ROS  Dilation: 5.5 Effacement (%): 80 Station: -3 Exam by:: jolynn Blood pressure 137/79, pulse 93, temperature 98 F (36.7 C), temperature source Oral, resp. rate 18, height 5' (1.524 m), weight 201 lb (91.173 kg), last menstrual period 03/09/2014, SpO2 100 %. Exam Physical Exam  Prenatal labs: ABO, Rh: B/Positive/-- (05/24 0000) Antibody: Negative (05/24 0000) Rubella: Immune (05/24 0000) RPR: Nonreactive (05/24 0000)  HBsAg: Negative (05/24 0000)  HIV: Non-reactive (05/24 0000)  GBS: Negative (10/20 0000)   Assessment/Plan: 1) Admit 2) Epidural on request 3) AROM when able   Eliu Batch H. 12/07/2014, 9:45 AM

## 2014-12-07 NOTE — [facility] Note (Signed)
Not in lobby

## 2014-12-07 NOTE — Anesthesia Procedure Notes (Signed)
Epidural Patient location during procedure: OB  Staffing Anesthesiologist: Tiffany Talarico Performed by: anesthesiologist   Preanesthetic Checklist Completed: patient identified, site marked, surgical consent, pre-op evaluation, timeout performed, IV checked, risks and benefits discussed and monitors and equipment checked  Epidural Patient position: sitting Prep: site prepped and draped and DuraPrep Patient monitoring: continuous pulse ox and blood pressure Approach: midline Location: L3-L4 Injection technique: LOR saline  Needle:  Needle type: Tuohy  Needle gauge: 17 G Needle length: 9 cm and 9 Needle insertion depth: 8 cm Catheter type: closed end flexible Catheter size: 19 Gauge Catheter at skin depth: 13 cm Test dose: negative  Assessment Events: blood not aspirated, injection not painful, no injection resistance, negative IV test and no paresthesia  Additional Notes Patient identified. Risks/Benefits/Options discussed with patient including but not limited to bleeding, infection, nerve damage, paralysis, failed block, incomplete pain control, headache, blood pressure changes, nausea, vomiting, reactions to medication both or allergic, itching and postpartum back pain. Confirmed with bedside nurse the patient's most recent platelet count. Confirmed with patient that they are not currently taking any anticoagulation, have any bleeding history or any family history of bleeding disorders. Patient expressed understanding and wished to proceed. All questions were answered. Sterile technique was used throughout the entire procedure. Please see nursing notes for vital signs. Test dose was given through epidural catheter and negative prior to continuing to dose epidural or start infusion. Warning signs of high block given to the patient including shortness of breath, tingling/numbness in hands, complete motor block, or any concerning symptoms with instructions to call for help. Patient was  given instructions on fall risk and not to get out of bed. All questions and concerns addressed with instructions to call with any issues or inadequate analgesia.    Single pass, easily placed

## 2014-12-07 NOTE — MAU Note (Signed)
Contractions started around 0500, now every 6 min.  No bleeding, mucous d/c noted. Was 3 cm

## 2014-12-07 NOTE — Anesthesia Postprocedure Evaluation (Signed)
  Anesthesia Post-op Note  Patient: Charlene Petty  Procedure(s) Performed: Procedure(s) (LRB): CESAREAN SECTION (N/A)  Patient Location: PACU  Anesthesia Type: Epidural  Level of Consciousness: awake and alert   Airway and Oxygen Therapy: Patient Spontanous Breathing  Post-op Pain: mild  Post-op Assessment: Post-op Vital signs reviewed, Patient's Cardiovascular Status Stable, Respiratory Function Stable, Patent Airway and No signs of Nausea or vomiting  Last Vitals:  Filed Vitals:   12/07/14 1603  BP: 135/64  Pulse: 114  Temp: 36.7 C  Resp: 18    Post-op Vital Signs: stable   Complications: No apparent anesthesia complications

## 2014-12-07 NOTE — Transfer of Care (Signed)
Immediate Anesthesia Transfer of Care Note  Patient: Charlene Petty  Procedure(s) Performed: Procedure(s): CESAREAN SECTION (N/A)  Patient Location: PACU  Anesthesia Type:Epidural  Level of Consciousness: awake, alert  and oriented  Airway & Oxygen Therapy: Patient Spontanous Breathing  Post-op Assessment: Post -op Vital signs reviewed and stable  Post vital signs: Reviewed and stable  Last Vitals:  Filed Vitals:   12/07/14 1427  BP:   Pulse:   Temp: 36.4 C  Resp:     Complications: No apparent anesthesia complications

## 2014-12-07 NOTE — Progress Notes (Signed)
To OR per Bed.

## 2014-12-07 NOTE — Op Note (Signed)
Pre-Operative Diagnosis: 1) 39+0 week intrauterine pregnancy 2) Category 3 tracing remote from delivery Postoperative Diagnosis: Same Procedure: Primary emergency cesarean section Surgeon: Dr. Waynard ReedsKendra Yoselin Amerman Assistant: None Operative Findings: Female infant in the vertex OP presentation with apgars of 9 at 1 and 9 at 5 minutes. Cord pH 7.16. A 2cm laceration of the left cheek of the infant occurred with entry into the uterus. Ovaries and tubes were not inspected at the time of the surgery Specimen: Placenta for disposal EBL: Total I/O In: 2400 [I.V.:2400] Out: 800 [Urine:150; Blood:650]   Procedure: [name] is an 24 year old gravida 1 para 0 at 3539 weeks and 0 days estimated gestational age who presents for cesarean section. The patient presented in early labor and received epidural anesthesia. Amniotomy was ultimately performed and  Within 30 minutes of amniotomy fetal tracing developed severe, intermittent bradycardia. Tachysystole was occurring at the same time. Terbutaline was administered  And an IV fluid bolus was given to the patient. The fetal tracing recovered. After an additional 30 minutes it appeared that severe variables were occurring. An intrauterine pressure catheter was placed and an amnioinfusion was initiated. The fetal tracing responded well to this, however within an additional 30 minutes late decelerations were occurring with each contraction and the time to return to baseline was prolonging. No significant cervical change had occurred and given that we were remote from delivery recommendation was made to proceed with cesarean. During the discussion for need for cesarean tachysystole again developed an terbutaline was again given. The fetal tracing did not respond as well to the second dose of terbutaline and the decision was made to proceed to the OR for an emergent cesarean section. Following the appropriate informed consent the patient was brought to the operating room where  epidural anesthesia was found to be adequate. She was placed in the dorsal supine position with a leftward tilt. She was prepped and draped in the normal sterile fashion. Scalpel was then used to make a Pfannenstiel skin incision which was carried down to the underlying layers of soft tissue to the fascia. The fascial incision was extended with blunt dissection and the rectus muscles were separated in the midline with blunt dissection. The abdominal peritoneum was identified and  Entered with a hemostat and the incision was extended with blunt dissection. A bladder blade was inserted and the lower uterine segment was identified. A scalpel was then used to make a low transverse incision on the uterus which was then extended with blunt dissection. The fetal vertex was identified and delivered through the uterine incision followed by the body. The infant cried vigorously on the operative field. The cord was clamped, cut and the infant was passed to the waiting NICU team. A cord gas was collected as well as cord blood. The placenta delivered easily spontaneously. The uterus was cleared of all clot and debris. The uterine incision was repaired with #1 chromic in a running locked fashion followed by a second imbricating layer. The peritoneum was closed with 2-0 Vicryl in a running fashion the rectus muscles were then closed with 2-0 chromic in a running fashion. There fashion was closed with a looped PDS in a running fashion.the skin was closed with 40 Mellody DanceKeith in a subcuticular fashion and skin glue was applied to the incision. Due to the emergent nature of the procedure and adequate sponge count could not be performed prior to the procedure, therefore a abdominal x-ray was performed at the end of the case. The x-ray was negative and  this completed the procedure. The patient was taken to the postanesthesia recovery unit in stable condition for the procedure.

## 2014-12-07 NOTE — Anesthesia Preprocedure Evaluation (Signed)
Anesthesia Evaluation  Patient identified by MRN, date of birth, ID band Patient awake    Reviewed: Allergy & Precautions, NPO status , Patient's Chart, lab work & pertinent test results  History of Anesthesia Complications Negative for: history of anesthetic complications  Airway Mallampati: III  TM Distance: >3 FB Neck ROM: Full    Dental no notable dental hx. (+) Dental Advisory Given   Pulmonary neg pulmonary ROS,    Pulmonary exam normal breath sounds clear to auscultation       Cardiovascular negative cardio ROS Normal cardiovascular exam Rhythm:Regular Rate:Normal     Neuro/Psych negative neurological ROS  negative psych ROS   GI/Hepatic negative GI ROS, Neg liver ROS,   Endo/Other  Morbid obesity  Renal/GU negative Renal ROS  negative genitourinary   Musculoskeletal negative musculoskeletal ROS (+)   Abdominal   Peds negative pediatric ROS (+)  Hematology negative hematology ROS (+)   Anesthesia Other Findings   Reproductive/Obstetrics (+) Pregnancy                             Anesthesia Physical Anesthesia Plan  ASA: III  Anesthesia Plan: Epidural   Post-op Pain Management:    Induction:   Airway Management Planned:   Additional Equipment:   Intra-op Plan:   Post-operative Plan:   Informed Consent: I have reviewed the patients History and Physical, chart, labs and discussed the procedure including the risks, benefits and alternatives for the proposed anesthesia with the patient or authorized representative who has indicated his/her understanding and acceptance.   Dental advisory given  Plan Discussed with:   Anesthesia Plan Comments:         Anesthesia Quick Evaluation

## 2014-12-08 ENCOUNTER — Encounter (HOSPITAL_COMMUNITY): Payer: Self-pay | Admitting: *Deleted

## 2014-12-08 LAB — CBC
HEMATOCRIT: 26.2 % — AB (ref 36.0–46.0)
HEMOGLOBIN: 8.9 g/dL — AB (ref 12.0–15.0)
MCH: 29.5 pg (ref 26.0–34.0)
MCHC: 34 g/dL (ref 30.0–36.0)
MCV: 86.8 fL (ref 78.0–100.0)
Platelets: 207 10*3/uL (ref 150–400)
RBC: 3.02 MIL/uL — ABNORMAL LOW (ref 3.87–5.11)
RDW: 14.8 % (ref 11.5–15.5)
WBC: 18.7 10*3/uL — ABNORMAL HIGH (ref 4.0–10.5)

## 2014-12-08 LAB — RPR: RPR: NONREACTIVE

## 2014-12-08 NOTE — Lactation Note (Signed)
This note was copied from the chart of Charlene Felicha Alicia. Lactation Consultation Note  Patient Name: Charlene Petty AOZHY'QToday's Date: 12/08/2014 Reason for consult: Follow-up assessment Baby is 7826 hours old and and having challenges with latching , mom has been hand expressing and  Pumping with hand pump with EBM yield. So far baby has received 31 ml of EBM via spoon.  Baby awake and dad just changed a wet and a stool diaper. LC assessed breast tissue and noted flat nipples  Bilaterally with semi compressible areolas ( mom was given shells yesterday but has not used them yet due to  Having a breast feeding bra) , LC assessed and the shells fit well with her bra and encouraged to use them .  Breast tissue boarder line for Nipple shield #20 and #24 . Baby latched well with both sizes and took total 8 ml of EBM ,  But due to tissue the NS didn't stay on well. Baby content after feeding.  LC recommended to continue breast shells between feedings , except when sleeping. @ feeding time - breast massage ,  Hand express, pre -pump with hand pump to make the base of the nipple more elastic so the Nipple Shield will fit better.  Also if EBM instill into the top of the Nipple Shield for an appetizer. And post pump after wards both breast for 10 -15 mins  To enhance milk coming in. Save milk to supplement beck .  Mom and dad aware the Nipple Shield is a boarder line fit and if they can' get the baby latched to call MBU RN or LC to show them  How to finger feed EBM . LC mentioned this all will take time due to mom areola edema.  Voids and stools adequate for age. Mom and dad receptive to teaching and plan. MBU RN aware of LC plan , and 3-11p LC.    Maternal Data Has patient been taught Hand Expression?: Yes Does the patient have breastfeeding experience prior to this delivery?: No  Feeding Feeding Type: Breast Milk Length of feed: 3 min  LATCH Score/Interventions Latch: Grasps breast easily,  tongue down, lips flanged, rhythmical sucking. Intervention(s): Skin to skin;Teach feeding cues;Waking techniques Intervention(s): Adjust position;Assist with latch;Breast massage;Breast compression  Audible Swallowing: Spontaneous and intermittent  Type of Nipple: Flat  Comfort (Breast/Nipple): Soft / non-tender     Hold (Positioning): Full assist, staff holds infant at breast Intervention(s): Breastfeeding basics reviewed  LATCH Score: 7  Lactation Tools Discussed/Used Tools: Pump;Shells (plans to post pump AFTER VISITORS ) Nipple shield size: 20 Shell Type: Inverted Breast pump type: Double-Electric Breast Pump Pump Review: Setup, frequency, and cleaning   Consult Status Consult Status: Follow-up Date: 12/09/14 Follow-up type: In-patient    Kathrin Greathouseorio, Myleen Brailsford Ann 12/08/2014, 3:33 PM

## 2014-12-08 NOTE — Lactation Note (Signed)
This note was copied from the chart of Charlene Petty. Lactation Consultation Note  Patient Name: Charlene Petty Reason for consult: Initial assessment [facility] is 9 hours old [facility] Nurse had told LC that mom had attempted to BF and was able to hand express/ manual pump ~385mL, which she gave to infant via spoon. When LC entered, FOB was holding baby while mom received her meds and they said he was acting hungry. Tried latching baby on right breast in cross-cradle. Mom's right nipple is flat and does not evert with compression or hand pump. Baby was unable to latch and did not express a lot of interest in opening his mouth wide. Mom's left nipple is a less flat and came out slightly after using hand pump. Mom tried latching baby on left breast in cross cradle hold and then football hold. Baby latched a few times and suckled but did not sustain a deep latch and came off. Again, baby was not upset or eager to keep trying. LC fed baby ~53mL of EBM via spoon. Provided mom with inverted nipple shells - showed mom how to use them and encouraged mom to wear them ~20 mins before feeds and not to sleep in them. Mom stated she does not have a nursing bra or sports bra with her but can have someone bring one tomorrow so will try the shells tomorrow. Reviewed options of using hand pump to stimulate nipple, importance of making wedge with breast and suggested mom use the shells tomorrow. Mom has WIC. Mom also stated she has an electric breast pump but is unsure of the brand. Mom stated she wants to keep trying to BF but that if it doesn't work, she'll probably ask for a bottle. Encouraged mom that she has a lot of milk so to keep working on it and ask for help from her nurse tonight &/or [facility]. Provided Breastfeeding booklet, feeding logs, and BF Resources; reviewed Lactation number and support groups. Mom & FOB report no questions at this time.    Maternal Data    Feeding Feeding Type: Breast Fed  LATCH Score/Interventions Latch: Too sleepy or reluctant, no latch achieved, no sucking elicited. Intervention(s): Skin to skin Intervention(s): Adjust position;Assist with latch;Breast compression  Audible Swallowing: None Intervention(s): Skin to skin;Hand expression  Type of Nipple: Flat Intervention(s): Shells;Hand pump  Comfort (Breast/Nipple): Soft / non-tender     Hold (Positioning): Assistance needed to correctly position infant at breast and maintain latch. Intervention(s): Support Pillows;Position options;Skin to skin  LATCH Score: 4  Lactation Tools Discussed/Used Tools: Shells;Pump Shell Type: Inverted Breast pump type: Manual WIC Program: Yes   Consult Status Consult Status: Follow-up Date: 12/08/14 Follow-up type: In-patient    Charlene Petty Petty, 12:05 AM

## 2014-12-08 NOTE — Addendum Note (Signed)
Addendum  created 12/08/14 [id number] by Lincoln BrighamAngela Draughon Reannah Totten, CRNA   Modules edited: Notes Section   Notes Section:  File: [id number]

## 2014-12-08 NOTE — Anesthesia Postprocedure Evaluation (Signed)
Anesthesia Post Note  Patient: Charlene Petty  Procedure(s) Performed: Procedure(s) (LRB): CESAREAN SECTION (N/A)  Anesthesia type: Epidural  Patient location: Mother/Baby  Post pain: Pain level controlled  Post assessment: Post-op Vital signs reviewed  Last Vitals:  Filed Vitals:   12/08/14 0510  BP: 136/74  Pulse: 114  Temp: 37 C  Resp: 18    Post vital signs: Reviewed  Level of consciousness:alert  Complications: No apparent anesthesia complications

## 2014-12-09 MED ORDER — OXYCODONE-ACETAMINOPHEN 5-325 MG PO TABS: 2.0000 | ORAL_TABLET | ORAL | Status: DC | PRN

## 2014-12-09 NOTE — Progress Notes (Addendum)
  Patient is eating, ambulating, voiding.  Pain control is good.  Filed Vitals:   12/08/14 1300 12/08/14 1755 12/08/14 1835 12/09/14 0620  BP: 134/76 137/90 133/68 125/73  Pulse: 110 97 93 87  Temp: 98.5 F (36.9 C) 98.3 F (36.8 C)  98.8 F (37.1 C)  TempSrc: Oral Oral  Oral  Resp: 16 18  16   Height:      Weight:      SpO2: 98% 100%      lungs:   clear to auscultation cor:    RRR Abdomen:  soft, appropriate tenderness, incisions intact and without erythema or exudate ex:    no cords   Lab Results  Component Value Date   WBC 18.7* 12/08/2014   HGB 8.9* 12/08/2014   HCT 26.2* 12/08/2014   MCV 86.8 12/08/2014   PLT 207 12/08/2014    --/--/B POS, B POS (11/12 0845)/RI  A/P    Post operative day 2.  Routine post op and postpartum care.  Expect d/c routine today.  Percocet for pain control. Iron for anemia.

## 2014-12-09 NOTE — Discharge Summary (Signed)
Obstetric Discharge Summary Reason for Admission: onset of labor Prenatal Procedures: none Intrapartum Procedures: cesarean: low cervical, transverse Postpartum Procedures: none Complications-Operative and Postpartum: none HEMOGLOBIN  Date Value Ref Range Status  12/08/2014 8.9* 12.0 - 15.0 g/dL Final   HCT  Date Value Ref Range Status  12/08/2014 26.2* 36.0 - 46.0 % Final     Discharge Diagnoses: Term Pregnancy-delivered  Discharge Information: Date: 12/09/2014 Activity: pelvic rest Diet: routine Medications: Iron and Percocet Condition: stable Instructions: refer to practice specific booklet Discharge to: home Follow-up Information    Follow up with Almon HerculesOSS,KENDRA H., MD In 4 weeks.   Specialty:  Obstetrics and Gynecology   Contact information:   [address] [place] [address] [phone]       Newborn Data: Live born female  Birth Weight: 6 lb 8.4 oz (2960 g) APGAR: 9, 9  Home with mother.  Charlene Petty 12/09/2014, 8:27 AM

## 2014-12-09 NOTE — Lactation Note (Signed)
This note was copied from the chart of Charlene Cherene Sisler. Lactation Consultation Note  FOB changing diaper upon entering the room.  Mother states breastfeeding has improved and the last time she pumped she expressed 40 ml. Offered to help latch baby since he is awake and alert. Mother states she prefers across versus football hold.  Attempted cross cradle but mother feels uncomfortable. Mother needed help reviewing hand expression.  Was able to express drops. Baby latched on L nipple, sucked a few times and slipped down to tip of nipple. Repeated and taught FOB how to help achieve a deeper latch. Baby comes off and on and has difficulty sustaining a deep latch.  Mother states that at other times he was able to sustain latch. Suggest mother eat lunch and post pump.  Continue practicing latch and depth and call for help if needed. Provided parents w/ volume guidelines for supplementation.  Patient Name: Charlene Petty BoysShakeyla Dokes ZOXWR'UToday's Date: 12/09/2014 Reason for consult: Follow-up assessment   Maternal Data    Feeding Feeding Type: Breast Fed Length of feed: 10 min  LATCH Score/Interventions Latch: Grasps breast easily, tongue down, lips flanged, rhythmical sucking.  Audible Swallowing: A few with stimulation  Type of Nipple: Flat  Comfort (Breast/Nipple): Soft / non-tender     Hold (Positioning): Assistance needed to correctly position infant at breast and maintain latch.  LATCH Score: 7  Lactation Tools Discussed/Used     Consult Status Consult Status: Follow-up Date: 12/10/14 Follow-up type: In-patient    Dahlia ByesBerkelhammer, Ruth [facility] 12/09/2014, 12:38 PM

## 2014-12-10 NOTE — Lactation Note (Signed)
This note was copied from the chart of Charlene Petty. Lactation Consultation Note  Patient Name: Charlene Petty ZOXWR'UToday's Date: 12/10/2014  baby 3071 hours old , 2% weight loss, 6-6.3 oz , baby has been to the breast , but also supplemented with formula  And EBM 15 -25 ml , voids and stools adequate.  Latch Score - 7-7-8-7 ,  Milk is in and mom is pumping off 80 ml. Sore nipple and engorgement prevention and tx reviewed .  Per mom has a DEBP Medela at home.  LC reviewed supply and demand.  Mother informed of post-discharge support and given phone number to the lactation department, including services for phone call assistance; out-patient appointments; and breastfeeding support group. List of other breastfeeding resources in the community given in the handout. Encouraged mother to call for problems or concerns related to breastfeeding.   Maternal Data    Feeding Feeding Type: Formula Nipple Type: Slow - flow  LATCH Score/Interventions                      Lactation Tools Discussed/Used     Consult Status      Charlene Petty, Charlene Petty Ann 12/10/2014, 12:33 PM

## 2014-12-10 NOTE — Progress Notes (Signed)
POD#3 Pt without complaints. Ready for discharge. PLan/Will discharge.

## 2016-11-16 IMAGING — CR DG ABD PORTABLE 1V
1 series · 1 of 1 positions shown · non-contrast
Comparison: None.

CLINICAL DATA: Status post emergent Cesarean section. Abnormal
sponge count.

EXAM:
PORTABLE ABDOMEN - 1 VIEW

[view not recorded]
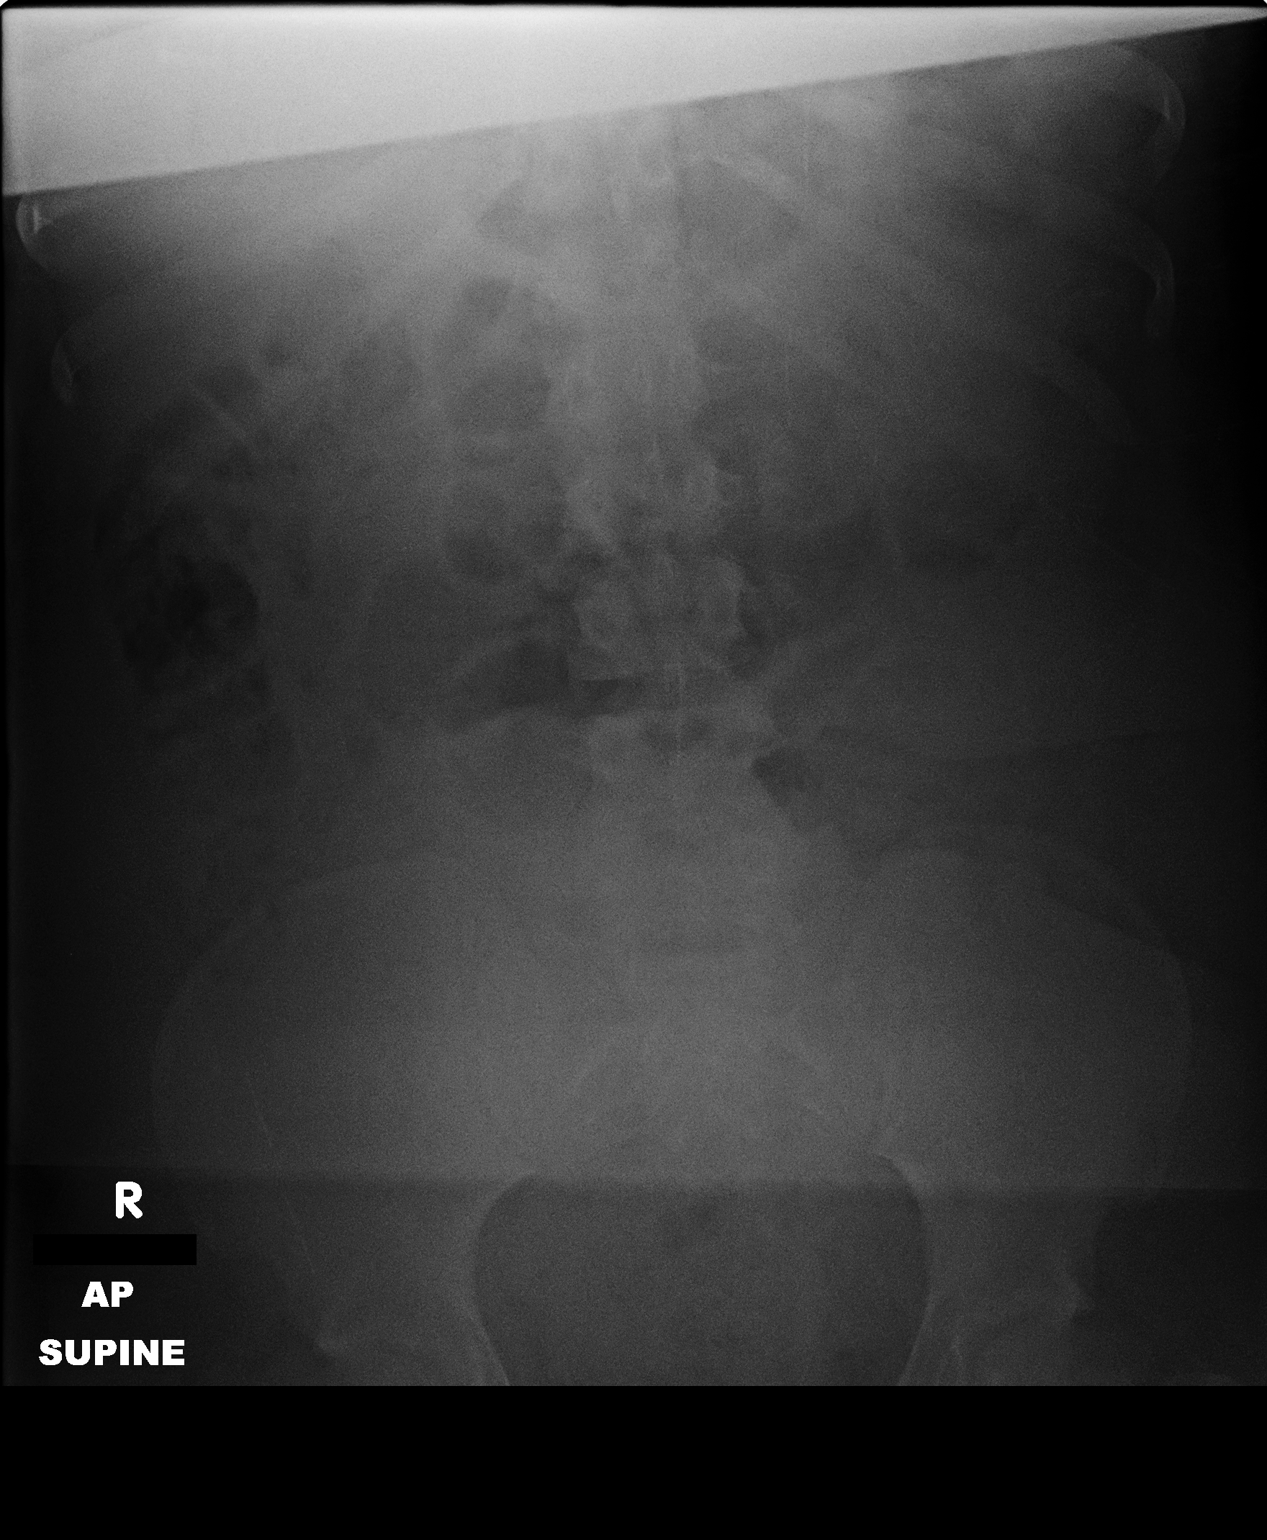

[1 of 1 positions shown; findings below may reference images not displayed]

FINDINGS: Penetration is limited on the portable film. No foreign body is
visualized. Bowel gas pattern is unremarkable.
IMPRESSION: No visualized foreign body.

## 2018-12-27 LAB — OB RESULTS CONSOLE ANTIBODY SCREEN: Antibody Screen: NEGATIVE

## 2018-12-27 LAB — OB RESULTS CONSOLE RPR: RPR: NONREACTIVE

## 2018-12-27 LAB — OB RESULTS CONSOLE GC/CHLAMYDIA
Chlamydia: NEGATIVE
Gonorrhea: NEGATIVE

## 2018-12-27 LAB — OB RESULTS CONSOLE ABO/RH: RH Type: POSITIVE

## 2018-12-27 LAB — OB RESULTS CONSOLE HIV ANTIBODY (ROUTINE TESTING): HIV: NONREACTIVE

## 2018-12-27 LAB — OB RESULTS CONSOLE RUBELLA ANTIBODY, IGM: Rubella: IMMUNE

## 2018-12-27 LAB — OB RESULTS CONSOLE HEPATITIS B SURFACE ANTIGEN: Hepatitis B Surface Ag: NEGATIVE

## 2019-05-08 ENCOUNTER — Other Ambulatory Visit: Payer: Self-pay | Admitting: Obstetrics and Gynecology

## 2019-06-08 LAB — OB RESULTS CONSOLE GBS: GBS: NEGATIVE

## 2019-06-14 ENCOUNTER — Encounter (HOSPITAL_COMMUNITY): Payer: Self-pay

## 2019-06-14 ENCOUNTER — Telehealth (HOSPITAL_COMMUNITY): Payer: Self-pay | Admitting: *Deleted

## 2019-06-14 NOTE — Telephone Encounter (Signed)
Preadmission screen  

## 2019-06-14 NOTE — Patient Instructions (Signed)
Charlene Petty  06/14/2019   Your procedure is scheduled on:  06/28/2019  Arrive at 0745 at Entrance C on [address] at [facility]  and [employer]. You are invited to use the FREE valet parking or use the Visitor's parking deck.  Pick up the phone at the desk and dial [phone].  Call this number if you have problems the morning of surgery: [phone]  Remember:   Do not eat food:(After Midnight) Desps de medianoche.  Do not drink clear liquids: (After Midnight) Desps de medianoche.  Take these medicines the morning of surgery with A SIP OF WATER:  none   Do not wear jewelry, make-up or nail polish.  Do not wear lotions, powders, or perfumes. Do not wear deodorant.  Do not shave 48 hours prior to surgery.  Do not bring valuables to the hospital.  [facility] is not   responsible for any belongings or valuables brought to the hospital.  Contacts, dentures or bridgework may not be worn into surgery.  Leave suitcase in the car. After surgery it may be brought to your room.  For patients admitted to the hospital, checkout time is 11:00 AM the day of              discharge.      Please read over the following fact sheets that you were given:     Preparing for Surgery

## 2019-06-15 ENCOUNTER — Telehealth (HOSPITAL_COMMUNITY): Payer: Self-pay | Admitting: *Deleted

## 2019-06-15 NOTE — Telephone Encounter (Signed)
Preadmission screen  

## 2019-06-18 ENCOUNTER — Telehealth (HOSPITAL_COMMUNITY): Payer: Self-pay | Admitting: *Deleted

## 2019-06-18 NOTE — Telephone Encounter (Signed)
Preadmission screen  

## 2019-06-19 ENCOUNTER — Inpatient Hospital Stay (HOSPITAL_COMMUNITY)
Admission: AD | Admit: 2019-06-19 | Discharge: 2019-06-21 | DRG: 788 | Disposition: A | Discharge status: Home / Self Care | Payer: Medicaid Other | Attending: Obstetrics & Gynecology | Admitting: Obstetrics & Gynecology

## 2019-06-19 ENCOUNTER — Encounter (HOSPITAL_COMMUNITY)
Admission: AD | Disposition: A | Discharge status: Home / Self Care | Payer: Self-pay | Source: Home / Self Care | Attending: Obstetrics & Gynecology

## 2019-06-19 ENCOUNTER — Inpatient Hospital Stay (HOSPITAL_COMMUNITY): Payer: Medicaid Other | Admitting: Anesthesiology

## 2019-06-19 ENCOUNTER — Encounter (HOSPITAL_COMMUNITY): Payer: Self-pay | Admitting: Obstetrics & Gynecology

## 2019-06-19 ENCOUNTER — Other Ambulatory Visit: Payer: Self-pay

## 2019-06-19 DIAGNOSIS — O99214 Obesity complicating childbirth: Secondary | ICD-10-CM | POA: Diagnosis present

## 2019-06-19 DIAGNOSIS — Z3A38 38 weeks gestation of pregnancy: Secondary | ICD-10-CM

## 2019-06-19 DIAGNOSIS — O34211 Maternal care for low transverse scar from previous cesarean delivery: Principal | ICD-10-CM | POA: Diagnosis present

## 2019-06-19 DIAGNOSIS — O26893 Other specified pregnancy related conditions, third trimester: Secondary | ICD-10-CM | POA: Diagnosis present

## 2019-06-19 DIAGNOSIS — Z20822 Contact with and (suspected) exposure to covid-19: Secondary | ICD-10-CM | POA: Diagnosis present

## 2019-06-19 DIAGNOSIS — O24420 Gestational diabetes mellitus in childbirth, diet controlled: Secondary | ICD-10-CM | POA: Diagnosis present

## 2019-06-19 LAB — CBC
HCT: 37.9 % (ref 36.0–46.0)
Hemoglobin: 12.5 g/dL (ref 12.0–15.0)
MCH: 30.6 pg (ref 26.0–34.0)
MCHC: 33 g/dL (ref 30.0–36.0)
MCV: 92.9 fL (ref 80.0–100.0)
Platelets: 228 10*3/uL (ref 150–400)
RBC: 4.08 MIL/uL (ref 3.87–5.11)
RDW: 13.9 % (ref 11.5–15.5)
WBC: 10.5 10*3/uL (ref 4.0–10.5)
nRBC: 0 % (ref 0.0–0.2)

## 2019-06-19 LAB — ABO/RH: ABO/RH(D): B POS

## 2019-06-19 LAB — SARS CORONAVIRUS 2 BY RT PCR (HOSPITAL ORDER, PERFORMED IN ~~LOC~~ HOSPITAL LAB): SARS Coronavirus 2: NEGATIVE

## 2019-06-19 LAB — TYPE AND SCREEN
ABO/RH(D): B POS
Antibody Screen: NEGATIVE

## 2019-06-19 SURGERY — Surgical Case
Anesthesia: Epidural | Wound class: Clean Contaminated

## 2019-06-19 MED ORDER — LIDOCAINE HCL (PF) 1 % IJ SOLN: 30.0000 mL | INTRAMUSCULAR | Status: DC | PRN

## 2019-06-19 MED ORDER — OXYCODONE HCL 5 MG PO TABS: 5.0000 mg | ORAL_TABLET | Freq: Once | ORAL | Status: DC | PRN

## 2019-06-19 MED ORDER — FENTANYL CITRATE (PF) 100 MCG/2ML IJ SOLN
INTRAMUSCULAR | Status: AC
  Filled 2019-06-19: qty 2

## 2019-06-19 MED ORDER — OXYTOCIN 40 UNITS IN NORMAL SALINE INFUSION - SIMPLE MED
INTRAVENOUS | Status: AC
  Filled 2019-06-19: qty 1000

## 2019-06-19 MED ORDER — DIPHENHYDRAMINE HCL 50 MG/ML IJ SOLN: 12.5000 mg | INTRAMUSCULAR | Status: DC | PRN

## 2019-06-19 MED ORDER — PHENYLEPHRINE 40 MCG/ML (10ML) SYRINGE FOR IV PUSH (FOR BLOOD PRESSURE SUPPORT): 80.0000 ug | PREFILLED_SYRINGE | INTRAVENOUS | Status: DC | PRN

## 2019-06-19 MED ORDER — FENTANYL CITRATE (PF) 250 MCG/5ML IJ SOLN
INTRAMUSCULAR | Status: DC | PRN
  Administered 2019-06-19: 100 ug via EPIDURAL

## 2019-06-19 MED ORDER — ONDANSETRON HCL 4 MG/2ML IJ SOLN: 4.0000 mg | Freq: Once | INTRAMUSCULAR | Status: DC | PRN

## 2019-06-19 MED ORDER — ACETAMINOPHEN 325 MG PO TABS: 650.0000 mg | ORAL_TABLET | ORAL | Status: DC | PRN

## 2019-06-19 MED ORDER — SODIUM CHLORIDE 0.9 % IV SOLN
500.0000 mg | Freq: Once | INTRAVENOUS | Status: AC
  Administered 2019-06-19: 500 mg via INTRAVENOUS

## 2019-06-19 MED ORDER — OXYCODONE-ACETAMINOPHEN 5-325 MG PO TABS: 1.0000 | ORAL_TABLET | ORAL | Status: DC | PRN

## 2019-06-19 MED ORDER — SOD CITRATE-CITRIC ACID 500-334 MG/5ML PO SOLN
30.0000 mL | ORAL | Status: DC | PRN
  Filled 2019-06-19: qty 30

## 2019-06-19 MED ORDER — LIDOCAINE HCL (PF) 1 % IJ SOLN
INTRAMUSCULAR | Status: DC | PRN
  Administered 2019-06-19: 6 mL via EPIDURAL

## 2019-06-19 MED ORDER — LACTATED RINGERS IV SOLN
500.0000 mL | INTRAVENOUS | Status: DC | PRN
  Administered 2019-06-19: 500 mL via INTRAVENOUS

## 2019-06-19 MED ORDER — FENTANYL-BUPIVACAINE-NACL 0.5-0.125-0.9 MG/250ML-% EP SOLN
12.0000 mL/h | EPIDURAL | Status: DC | PRN
  Filled 2019-06-19: qty 250

## 2019-06-19 MED ORDER — OXYCODONE-ACETAMINOPHEN 5-325 MG PO TABS: 2.0000 | ORAL_TABLET | ORAL | Status: DC | PRN

## 2019-06-19 MED ORDER — FENTANYL CITRATE (PF) 100 MCG/2ML IJ SOLN: 25.0000 ug | INTRAMUSCULAR | Status: DC | PRN

## 2019-06-19 MED ORDER — LIDOCAINE-EPINEPHRINE (PF) 2 %-1:200000 IJ SOLN
INTRAMUSCULAR | Status: AC
  Filled 2019-06-19: qty 10

## 2019-06-19 MED ORDER — EPHEDRINE 5 MG/ML INJ: 10.0000 mg | INTRAVENOUS | Status: DC | PRN

## 2019-06-19 MED ORDER — SOD CITRATE-CITRIC ACID 500-334 MG/5ML PO SOLN: 30.0000 mL | ORAL | Status: DC

## 2019-06-19 MED ORDER — SODIUM BICARBONATE 8.4 % IV SOLN
INTRAVENOUS | Status: DC | PRN
  Administered 2019-06-19: 3 mL via EPIDURAL
  Administered 2019-06-19: 10 mL via EPIDURAL

## 2019-06-19 MED ORDER — MORPHINE SULFATE (PF) 0.5 MG/ML IJ SOLN
INTRAMUSCULAR | Status: AC
  Filled 2019-06-19: qty 10

## 2019-06-19 MED ORDER — CEFAZOLIN SODIUM-DEXTROSE 2-4 GM/100ML-% IV SOLN
2.0000 g | INTRAVENOUS | Status: AC
  Administered 2019-06-19: 2 g via INTRAVENOUS

## 2019-06-19 MED ORDER — SODIUM CHLORIDE (PF) 0.9 % IJ SOLN
INTRAMUSCULAR | Status: DC | PRN
  Administered 2019-06-19: 12 mL/h via EPIDURAL

## 2019-06-19 MED ORDER — SODIUM CHLORIDE 0.9 % IV SOLN
INTRAVENOUS | Status: AC
  Filled 2019-06-19: qty 500

## 2019-06-19 MED ORDER — ONDANSETRON HCL 4 MG/2ML IJ SOLN
4.0000 mg | Freq: Four times a day (QID) | INTRAMUSCULAR | Status: DC | PRN
  Administered 2019-06-19: 4 mg via INTRAVENOUS

## 2019-06-19 MED ORDER — ONDANSETRON HCL 4 MG/2ML IJ SOLN
INTRAMUSCULAR | Status: AC
  Filled 2019-06-19: qty 2

## 2019-06-19 MED ORDER — OXYTOCIN 40 UNITS IN NORMAL SALINE INFUSION - SIMPLE MED
1.0000 m[IU]/min | INTRAVENOUS | Status: DC
  Administered 2019-06-19: 2 m[IU]/min via INTRAVENOUS
  Filled 2019-06-19: qty 1000

## 2019-06-19 MED ORDER — TERBUTALINE SULFATE 1 MG/ML IJ SOLN: 0.2500 mg | Freq: Once | INTRAMUSCULAR | Status: DC | PRN

## 2019-06-19 MED ORDER — SODIUM CHLORIDE 0.9 % IR SOLN
Status: DC | PRN
  Administered 2019-06-19: 1

## 2019-06-19 MED ORDER — MORPHINE SULFATE (PF) 0.5 MG/ML IJ SOLN
INTRAMUSCULAR | Status: DC | PRN
  Administered 2019-06-19: 3 mg via EPIDURAL

## 2019-06-19 MED ORDER — LACTATED RINGERS IV SOLN: INTRAVENOUS | Status: DC

## 2019-06-19 MED ORDER — ACETAMINOPHEN 160 MG/5ML PO SOLN: 325.0000 mg | ORAL | Status: DC | PRN

## 2019-06-19 MED ORDER — ACETAMINOPHEN 325 MG PO TABS: 325.0000 mg | ORAL_TABLET | ORAL | Status: DC | PRN

## 2019-06-19 MED ORDER — OXYTOCIN 40 UNITS IN NORMAL SALINE INFUSION - SIMPLE MED
2.5000 [IU]/h | INTRAVENOUS | Status: DC
  Administered 2019-06-19: 40 [IU] via INTRAVENOUS

## 2019-06-19 MED ORDER — OXYCODONE HCL 5 MG/5ML PO SOLN: 5.0000 mg | Freq: Once | ORAL | Status: DC | PRN

## 2019-06-19 MED ORDER — SODIUM CHLORIDE 0.9 % IV SOLN: INTRAVENOUS | Status: DC | PRN

## 2019-06-19 MED ORDER — OXYTOCIN BOLUS FROM INFUSION: 500.0000 mL | Freq: Once | INTRAVENOUS | Status: DC

## 2019-06-19 MED ORDER — MEPERIDINE HCL 25 MG/ML IJ SOLN: 6.2500 mg | INTRAMUSCULAR | Status: DC | PRN

## 2019-06-19 MED ORDER — FENTANYL-BUPIVACAINE-NACL 0.5-0.125-0.9 MG/250ML-% EP SOLN: 12.0000 mL/h | EPIDURAL | Status: DC | PRN

## 2019-06-19 MED ORDER — CEFAZOLIN SODIUM-DEXTROSE 2-4 GM/100ML-% IV SOLN
INTRAVENOUS | Status: AC
  Filled 2019-06-19: qty 100

## 2019-06-19 MED ORDER — DEXAMETHASONE SODIUM PHOSPHATE 10 MG/ML IJ SOLN
INTRAMUSCULAR | Status: AC
  Filled 2019-06-19: qty 1

## 2019-06-19 MED ORDER — LACTATED RINGERS IV SOLN: 500.0000 mL | Freq: Once | INTRAVENOUS | Status: DC

## 2019-06-19 SURGICAL SUPPLY — 35 items
BENZOIN TINCTURE PRP APPL 2/3 (GAUZE/BANDAGES/DRESSINGS) ×2 IMPLANT
CHLORAPREP W/TINT 26ML (MISCELLANEOUS) ×3 IMPLANT
CLAMP CORD UMBIL (MISCELLANEOUS) IMPLANT
CLOSURE WOUND 1/2 X4 (GAUZE/BANDAGES/DRESSINGS) ×1
CLOTH BEACON ORANGE TIMEOUT ST (SAFETY) ×3 IMPLANT
DRSG OPSITE POSTOP 4X10 (GAUZE/BANDAGES/DRESSINGS) ×3 IMPLANT
ELECT REM PT RETURN 9FT ADLT (ELECTROSURGICAL) ×3
ELECTRODE REM PT RTRN 9FT ADLT (ELECTROSURGICAL) ×1 IMPLANT
EXTRACTOR VACUUM M CUP 4 TUBE (SUCTIONS) IMPLANT
EXTRACTOR VACUUM M CUP 4' TUBE (SUCTIONS)
GLOVE BIOGEL PI IND STRL 6.5 (GLOVE) ×1 IMPLANT
GLOVE BIOGEL PI IND STRL 7.0 (GLOVE) ×1 IMPLANT
GLOVE BIOGEL PI INDICATOR 6.5 (GLOVE) ×2
GLOVE BIOGEL PI INDICATOR 7.0 (GLOVE) ×2
GLOVE ECLIPSE 6.5 STRL STRAW (GLOVE) ×3 IMPLANT
GOWN STRL REUS W/TWL LRG LVL3 (GOWN DISPOSABLE) ×6 IMPLANT
KIT ABG SYR 3ML LUER SLIP (SYRINGE) IMPLANT
NDL HYPO 25X5/8 SAFETYGLIDE (NEEDLE) IMPLANT
NEEDLE HYPO 25X5/8 SAFETYGLIDE (NEEDLE) IMPLANT
NS IRRIG 1000ML POUR BTL (IV SOLUTION) ×3 IMPLANT
PACK C SECTION WH (CUSTOM PROCEDURE TRAY) ×3 IMPLANT
PAD ABD 7.5X8 STRL (GAUZE/BANDAGES/DRESSINGS) IMPLANT
PAD OB MATERNITY 4.3X12.25 (PERSONAL CARE ITEMS) ×3 IMPLANT
PENCIL SMOKE EVAC W/HOLSTER (ELECTROSURGICAL) ×3 IMPLANT
RTRCTR C-SECT PINK 25CM LRG (MISCELLANEOUS) ×3 IMPLANT
STRIP CLOSURE SKIN 1/2X4 (GAUZE/BANDAGES/DRESSINGS) ×1 IMPLANT
SUT MON AB 2-0 CT1 27 (SUTURE) ×3 IMPLANT
SUT PDS AB 0 CTX 60 (SUTURE) IMPLANT
SUT PLAIN 2 0 XLH (SUTURE) IMPLANT
SUT VIC AB 0 CTX 36 (SUTURE) ×8
SUT VIC AB 0 CTX36XBRD ANBCTRL (SUTURE) ×4 IMPLANT
SUT VIC AB 4-0 KS 27 (SUTURE) ×3 IMPLANT
TOWEL OR 17X24 6PK STRL BLUE (TOWEL DISPOSABLE) ×3 IMPLANT
TRAY FOLEY W/BAG SLVR 14FR LF (SET/KITS/TRAYS/PACK) ×3 IMPLANT
WATER STERILE IRR 1000ML POUR (IV SOLUTION) ×3 IMPLANT

## 2019-06-19 NOTE — Brief Op Note (Signed)
06/19/2019  11:05 PM  PATIENT:  Charlene Petty  29 y.o. female  PRE-OPERATIVE DIAGNOSIS:  Repeat C-Section; NonReassurring fetal heartrate  POST-OPERATIVE DIAGNOSIS:  Repeat C-Section; NonReassurring fetal heartrate  PROCEDURE:  Procedure(s): CESAREAN SECTION (N/A)  SURGEON:  Surgeon(s) and Role:    * Taam-Akelman, Griselda Miner, MD - Primary  ANESTHESIA:   epidural  EBL: pending   BLOOD ADMINISTERED:none  DRAINS: none   LOCAL MEDICATIONS USED:  NONE  SPECIMEN:  Source of Specimen:  placenta   DISPOSITION OF SPECIMEN:   L&D  COUNTS:  YES  TOURNIQUET:  * No tourniquets in log *  DICTATION: .Note written in EPIC  PLAN OF CARE: Admit to inpatient   PATIENT DISPOSITION:  PACU - hemodynamically stable.   Delay start of Pharmacological VTE agent (>24hrs) due to surgical blood loss or risk of bleeding: not applicable Lanita Stammen K Taam-Akelman 06/19/19 11:06 PM

## 2019-06-19 NOTE — Anesthesia Preprocedure Evaluation (Signed)
Anesthesia Evaluation  Patient identified by MRN, date of birth, ID band Patient awake    Reviewed: Allergy & Precautions, H&P , NPO status , Patient's Chart, lab work & pertinent test results, reviewed documented beta blocker date and time   Airway Mallampati: III  TM Distance: >3 FB Neck ROM: full    Dental no notable dental hx.    Pulmonary neg pulmonary ROS,    Pulmonary exam normal breath sounds clear to auscultation       Cardiovascular negative cardio ROS Normal cardiovascular exam Rhythm:regular Rate:Normal     Neuro/Psych negative neurological ROS  negative psych ROS   GI/Hepatic negative GI ROS, Neg liver ROS,   Endo/Other  diabetesMorbid obesity  Renal/GU negative Renal ROS  negative genitourinary   Musculoskeletal   Abdominal (+) + obese,   Peds  Hematology negative hematology ROS (+)   Anesthesia Other Findings   Reproductive/Obstetrics (+) Pregnancy                             Anesthesia Physical Anesthesia Plan  ASA: III  Anesthesia Plan: Epidural   Post-op Pain Management:    Induction:   PONV Risk Score and Plan:   Airway Management Planned:   Additional Equipment:   Intra-op Plan:   Post-operative Plan:   Informed Consent: I have reviewed the patients History and Physical, chart, labs and discussed the procedure including the risks, benefits and alternatives for the proposed anesthesia with the patient or authorized representative who has indicated his/her understanding and acceptance.       Plan Discussed with: Anesthesiologist  Anesthesia Plan Comments:         Anesthesia Quick Evaluation

## 2019-06-19 NOTE — Progress Notes (Signed)
OBGYN Note S- comfortable with epidural O Vitals:   06/19/19 1902 06/19/19 1932 06/19/19 2002 06/19/19 2032  BP: (!) 141/68 127/74 136/70 (!) 132/58  Pulse: 90 86 90 94  Resp:      Temp:      TempSrc:      SpO2:      Weight:      Height:       SVE 6.5/100/-1  Fetal status: FHR140-150s, moderate variability, scalp stimulation, having variable decels, turned off pitocin, gave fluid bolus, changed positions, placed IUPC and scalp electrode, now improving, continue close monitoring. Discussed need for CS if fetal status non reassuring or if cervix doesn't change and unable to restart pitocin. Patient agrees with plan.  Charlene Petty K Taam-Akelman 06/19/19 9:01 PM

## 2019-06-19 NOTE — Transfer of Care (Signed)
Immediate Anesthesia Transfer of Care Note  Patient: Charlene Petty  Procedure(s) Performed: CESAREAN SECTION (N/A )  Patient Location: PACU  Anesthesia Type:Epidural  Level of Consciousness: awake, alert , oriented and patient cooperative  Airway & Oxygen Therapy: Patient Spontanous Breathing  Post-op Assessment: Report given to RN and Post -op Vital signs reviewed and stable  Post vital signs: Reviewed and stable  Last Vitals:  Vitals Value Taken Time  BP 152/56 06/19/19 2312  Temp    Pulse 103 06/19/19 2316  Resp 27 06/19/19 2316  SpO2 100 % 06/19/19 2316  Vitals shown include unvalidated device data.  Last Pain:  Vitals:   06/19/19 2032  TempSrc: Oral  PainSc:          Complications: No apparent anesthesia complications

## 2019-06-19 NOTE — Anesthesia Procedure Notes (Signed)
Epidural Patient location during procedure: OB Start time: 06/19/2019 6:04 PM End time: 06/19/2019 6:10 PM  Staffing Anesthesiologist: Bethena Midget, MD  Preanesthetic Checklist Completed: patient identified, IV checked, site marked, risks and benefits discussed, surgical consent, monitors and equipment checked, pre-op evaluation and timeout performed  Epidural Patient position: sitting Prep: DuraPrep and site prepped and draped Patient monitoring: continuous pulse ox and blood pressure Approach: midline Location: L3-L4 Injection technique: LOR air  Needle:  Needle type: Tuohy  Needle gauge: 17 G Needle length: 9 cm and 9 Needle insertion depth: 9 cm Catheter type: closed end flexible Catheter size: 19 Gauge Catheter at skin depth: 15 cm Test dose: negative  Assessment Events: blood not aspirated, injection not painful, no injection resistance, no paresthesia and negative IV test

## 2019-06-19 NOTE — Op Note (Signed)
Procedure(s): CESAREAN SECTION Procedure Note  Charlene Petty female 29 y.o. 06/19/2019  Procedure(s) and Anesthesia Type:    * CESAREAN SECTION - Regional  Surgeon(s) and Role:    * Taam-Akelman, Lawrence Santiago, MD - Primary  Indications: [name] 29 y.o. G2P1001 [redacted]w[redacted]d admitted for labor/TOLAC but due to non reassuring fetal status decision made to proceed with CS Labor course [name] was admitted due to painful contractions at 3cm, she then changed to 4.5/80/-2, pitocin was started and then she [place]. She received an epidural for pain management. Pitocin then had to be intermittently held due to variable decels. Patient progressed to 6.5/100/-1, but continued to have variable decels despite position changes, fluid bolus, holding pitocin. Due to non reassuring fetal status remote from delivery, I recommended proceeding with cesarean section, after reviewing consent, risks/benefits/alternatives, patient agreed with plan. Patient moved urgently to the OR for delivery.      Surgeon: [name]   Anesthesia: Epidural anesthesia   Procedure Detail  CESAREAN SECTION  Findings: Moderate adhesions from fascia to peritoneum. Anterior adhesion from bladder to uterine fundus. 3cm uterine window on the lower uterine segment. Normal mullerian anatomy.  Viable female infant with weight 6 lb 15 oz (3147 g). Apgars 7 and 8.  Estimated Blood Loss:  395cc         Specimens: Placenta to L&D         Complications:  None         Disposition: PACU - hemodynamically stable.         Condition: stable    Description of Procedure: The patient was taken to the operating room where epidural anesthesia was found to be adequate.  The patient was placed in the dorsal supine position.  Fetal heart tones were confirmed.  The patient was subsequently prepped and draped in the normal sterile fashion.    A low transverse skin incision was made with a scalpel and carried down to the  level of the fascia with the Bovie.  The fascia was incised in the midline with the scalpel and extended laterally with curved Mayo scissors.  Kocher clamps were applied to the superior fascial edge and the fascia was dissected off the rectus muscle sharply using the scalpel.  Moderate adhesions from fascia to peritoneum. The Kocher clamps were transferred to the inferior fascial edge and the underlying rectus muscle was dissected off with curved Mayo's scissors.  The rectus muscles then were separated in the midline. Due to adhesions the rectus muscles were transected off of the fascia laterally to make space.  The peritoneum was found free of adherent bowel and the peritoneal cavity was entered bluntly.  The uterus was identified, anterior adhesions from bladder to fundus was taken down sharply using Metzenbaum scissors. The alexis retractor was placed intraperitoneal.  A bladder flap was then created sharply with Metzenbaum scissors and separated from the lower uterine segment digitally.   A 3cm window was noted in the lower uterine segment and was entered bluntly making a low transverse hysterotomy.  The infant was found in the cephalic presentation was delivered atraumatically and without difficulty in the usual fashion.  The cord was clamped and cut and the infant was handed off to the pediatricians.  The placenta was delivered with gentle traction on umbilical cord and manual massage of the uterine fundus.  The uterus was cleared of all clot and debris.  The hysterotomy was then closed with 0 monocryl in a running locked fashion.  Followed  by 0 Monocryl in an imbricating fashion.  The hysterotomy was found to be hemostatic.  The peritoneum was closed with 2-0 Vicryl in a running fashion. The muscles were reapproximated with 2-0 vicryl.  The fascia was closed with a 0 Vicryl suture in a continuous running fashion from each side and tied in the midline.  The subcutaneous tissue was irrigated and rendered  hemostatic with cautery.  The subcutaneous layer was subsequently closed with 2-0 Vicryl in a continuous running fashion in two layers.  The skin was closed with 3-0 Monocryl in a running subcuticular fashion.  Sponge, lap and needle counts were correct. Steri strips and honeycomb dressing was placed on the incision.   [name] 06/19/19 11:07 PM

## 2019-06-19 NOTE — H&P (Signed)
Mackena Plummer is a 29 y.o. female G2P1001 [redacted]w[redacted]d presenting for labor. She reports no LOF, VB,. Reports regular, painful contractions. Normal FM.   OB History    Gravida  2   Para  1   Term  1   Preterm      AB      Living  1     SAB      TAB      Ectopic      Multiple  0   Live Births  1          Past Medical History:  Diagnosis Date  . Gestational diabetes   . Medical history non-contributory    Past Surgical History:  Procedure Laterality Date  . CESAREAN SECTION N/A 12/07/2014   Procedure: CESAREAN SECTION;  Surgeon: Waynard Reeds, MD;  Location: [facility];  Service: Obstetrics;  Laterality: N/A;  . NO PAST SURGERIES     Family History: family history is not on file. Social History:  reports that she has never smoked. She has never used smokeless tobacco. She reports that she does not drink alcohol or use drugs.     Maternal Diabetes: No Genetic Screening: Normal - first trimester screen neg Maternal Ultrasounds/Referrals: Normal, posterior placenta Fetal Ultrasounds or other Referrals:  None Maternal Substance Abuse:  No Significant Maternal Medications:  None Significant Maternal Lab Results:  Group B Strep negative Other Comments:  GDM in G1, passed early and regular glucola this pregnancy  Review of Systems Per HPI Exam Physical Exam  Dilation: 4.5 Effacement (%): 80 Station: -2 Exam by:: Mary [place] Johnson, RN  Blood pressure 131/82, pulse 91, temperature 98.8 F (37.1 C), temperature source Oral, resp. rate 16, height 5\' 3"  (1.6 m), weight 101.6 kg, last menstrual period 09/14/2018, SpO2 100 %, unknown if currently breastfeeding. NAD, resting comfortably Gravid abdomen Fetal testing: FHR 145, Cat 1. Toco q33m Prenatal labs: ABO, Rh:  --/--/B POS, B POS Performed at [facility], [address] [place], [place]  [phone]) Antibody: NEG (05/25 1357) Rubella: Immune (12/02 0000) RPR: Nonreactive (12/02 0000)  HBsAg:  Negative (12/02 0000)  HIV: Non-reactive (12/02 0000)  GBS: Negative/-- (05/14 0000)   Assessment/Plan: Yailyn Oser 28 y.o. G2P1001 at [redacted]w[redacted]d presents in labor/TOLAC 1. Labor: office SVE was 1cm, now contracting painfully and was 3/80/-2. Patient previously planning on repeat CS but on arrival to MAU wanted to discuss TOLAC. We reviewed risks/benefits/alternatives, discussed risk of uterine rupture, injury to bladder, uterus, bleeding, morbidity and mortality for baby, discussed risks of repeat cesarean section including infection, bleeding, damage to surrounding structures. We reviewed VBAC Flamm calculator 77% chance of success for vaginal delivery. After discussion with patient and her mom, patient opted for trial of labor.  -G1 CS 2/2 NRFS -Will augment with pitocin/AROM prn 2. Obesity BMI 39   Chyan Carnero K Taam-Akelman 06/19/2019, 5:53 PM

## 2019-06-19 NOTE — MAU Note (Signed)
Pt states she has been having contractions for the last three days but today they have gotten stronger and closer together. Reports an increase in vaginal discharge over the last three days as well. Denies VB. Reports good fetal movement.

## 2019-06-20 ENCOUNTER — Encounter (HOSPITAL_COMMUNITY): Payer: Self-pay | Admitting: Obstetrics & Gynecology

## 2019-06-20 LAB — CBC
HCT: 33.6 % — ABNORMAL LOW (ref 36.0–46.0)
Hemoglobin: 11.1 g/dL — ABNORMAL LOW (ref 12.0–15.0)
MCH: 30.6 pg (ref 26.0–34.0)
MCHC: 33 g/dL (ref 30.0–36.0)
MCV: 92.6 fL (ref 80.0–100.0)
Platelets: 205 10*3/uL (ref 150–400)
RBC: 3.63 MIL/uL — ABNORMAL LOW (ref 3.87–5.11)
RDW: 14 % (ref 11.5–15.5)
WBC: 16 10*3/uL — ABNORMAL HIGH (ref 4.0–10.5)
nRBC: 0 % (ref 0.0–0.2)

## 2019-06-20 LAB — RPR: RPR Ser Ql: NONREACTIVE

## 2019-06-20 MED ORDER — SENNOSIDES-DOCUSATE SODIUM 8.6-50 MG PO TABS
2.0000 | ORAL_TABLET | ORAL | Status: DC
  Administered 2019-06-20 (×2): 2 via ORAL
  Filled 2019-06-20 (×3): qty 2

## 2019-06-20 MED ORDER — WITCH HAZEL-GLYCERIN EX PADS: 1.0000 "application " | MEDICATED_PAD | CUTANEOUS | Status: DC | PRN

## 2019-06-20 MED ORDER — KETOROLAC TROMETHAMINE 30 MG/ML IJ SOLN
30.0000 mg | Freq: Four times a day (QID) | INTRAMUSCULAR | Status: AC
  Administered 2019-06-20 (×3): 30 mg via INTRAVENOUS
  Filled 2019-06-20 (×3): qty 1

## 2019-06-20 MED ORDER — LACTATED RINGERS IV SOLN: INTRAVENOUS | Status: DC

## 2019-06-20 MED ORDER — SIMETHICONE 80 MG PO CHEW
80.0000 mg | CHEWABLE_TABLET | Freq: Three times a day (TID) | ORAL | Status: DC
  Administered 2019-06-20 – 2019-06-21 (×5): 80 mg via ORAL
  Filled 2019-06-20 (×6): qty 1

## 2019-06-20 MED ORDER — PRENATAL MULTIVITAMIN CH
1.0000 | ORAL_TABLET | Freq: Every day | ORAL | Status: DC
  Administered 2019-06-20 – 2019-06-21 (×2): 1 via ORAL
  Filled 2019-06-20 (×3): qty 1

## 2019-06-20 MED ORDER — SIMETHICONE 80 MG PO CHEW
80.0000 mg | CHEWABLE_TABLET | ORAL | Status: DC | PRN
  Filled 2019-06-20: qty 1

## 2019-06-20 MED ORDER — TETANUS-DIPHTH-ACELL PERTUSSIS 5-2.5-18.5 LF-MCG/0.5 IM SUSP
0.5000 mL | Freq: Once | INTRAMUSCULAR | Status: DC
  Filled 2019-06-20: qty 0.5

## 2019-06-20 MED ORDER — COCONUT OIL OIL
1.0000 "application " | TOPICAL_OIL | Status: DC | PRN
  Filled 2019-06-20: qty 120

## 2019-06-20 MED ORDER — IBUPROFEN 800 MG PO TABS
800.0000 mg | ORAL_TABLET | Freq: Four times a day (QID) | ORAL | Status: DC
  Administered 2019-06-20 – 2019-06-21 (×4): 800 mg via ORAL
  Filled 2019-06-20 (×4): qty 1

## 2019-06-20 MED ORDER — ACETAMINOPHEN 325 MG PO TABS
650.0000 mg | ORAL_TABLET | ORAL | Status: DC | PRN
  Administered 2019-06-20: 650 mg via ORAL
  Filled 2019-06-20: qty 2

## 2019-06-20 MED ORDER — MENTHOL 3 MG MT LOZG
1.0000 | LOZENGE | OROMUCOSAL | Status: DC | PRN
  Filled 2019-06-20: qty 9

## 2019-06-20 MED ORDER — OXYTOCIN 40 UNITS IN NORMAL SALINE INFUSION - SIMPLE MED: 2.5000 [IU]/h | INTRAVENOUS | Status: AC

## 2019-06-20 MED ORDER — DIBUCAINE (PERIANAL) 1 % EX OINT
1.0000 "application " | TOPICAL_OINTMENT | CUTANEOUS | Status: DC | PRN
  Filled 2019-06-20: qty 28

## 2019-06-20 MED ORDER — DIPHENHYDRAMINE HCL 25 MG PO CAPS: 25.0000 mg | ORAL_CAPSULE | Freq: Four times a day (QID) | ORAL | Status: DC | PRN

## 2019-06-20 MED ORDER — OXYCODONE-ACETAMINOPHEN 5-325 MG PO TABS
1.0000 | ORAL_TABLET | ORAL | Status: DC | PRN
  Administered 2019-06-21: 2 via ORAL
  Administered 2019-06-21 (×3): 1 via ORAL
  Filled 2019-06-20: qty 2
  Filled 2019-06-20 (×3): qty 1

## 2019-06-20 MED ORDER — SIMETHICONE 80 MG PO CHEW
80.0000 mg | CHEWABLE_TABLET | ORAL | Status: DC
  Administered 2019-06-20 (×2): 80 mg via ORAL
  Filled 2019-06-20 (×3): qty 1

## 2019-06-20 NOTE — Anesthesia Postprocedure Evaluation (Signed)
Anesthesia Post Note  Patient: Designer, fashion/clothing  Procedure(s) Performed: CESAREAN SECTION (N/A )     Patient location during evaluation: Mother Baby Anesthesia Type: Epidural Level of consciousness: awake and alert Pain management: pain level controlled Vital Signs Assessment: post-procedure vital signs reviewed and stable Respiratory status: spontaneous breathing, nonlabored ventilation and respiratory function stable Cardiovascular status: stable Postop Assessment: no headache, no backache and epidural receding Anesthetic complications: no    Last Vitals:  Vitals:   06/20/19 0420 06/20/19 0505  BP: 138/72 118/68  Pulse: 97 99  Resp: 17 17  Temp:    SpO2: 100%     Last Pain:  Vitals:   06/20/19 0254  TempSrc: Oral  PainSc:    Pain Goal:                   Neoma Uhrich

## 2019-06-20 NOTE — Progress Notes (Signed)
Subjective: Postpartum Day 1: Cesarean Delivery Patient reports pain controlled, denies nausea/vomiting. Ambulating and voiding. Breastfeeding   Objective: Vital signs in last 24 hours: Temp:  [97.7 F (36.5 C)-98.8 F (37.1 C)] 98 F (36.7 C) (05/26 0940) Pulse Rate:  [84-112] 106 (05/26 0940) Resp:  [11-31] 18 (05/26 0940) BP: (114-152)/(56-99) 125/70 (05/26 0940) SpO2:  [87 %-100 %] 98 % (05/26 0940) Weight:  [101.6 kg] 101.6 kg (05/25 1429)  Physical Exam:  General: alert, cooperative and appears stated age Lochia: appropriate Uterine Fundus: firm Incision: healing well DVT Evaluation: No evidence of DVT seen on physical exam.  Recent Labs    06/19/19 1354 06/20/19 0602  HGB 12.5 11.1*  HCT 37.9 33.6*    Assessment/Plan: Status post Cesarean section. Doing well postoperatively.  Continue current care. Desires neonatal circumcision, R/B/A of procedure discussed at length. Pt understands that neonatal circumcision is not considered medically necessary and is elective. The risks include, but are not limited to bleeding, infection, damage to the penis, development of scar tissue, and having to have it redone at a later date. Pt understands theses risks and wishes to proceed   Charlene Petty 06/20/2019, 2:20 PM

## 2019-06-20 NOTE — Lactation Note (Signed)
This note was copied from a baby's chart. Lactation Consultation Note  Patient Name: Boy Jesiah Yerby [id number] Date: 06/20/2019 Reason for consult: Follow-up assessment;Early term 15-38.6wks Baby is 29 hours old.  Mom reports that baby is latching with little effort.  She states her nipples are flat so she pre pumps with the manual pump.  Discussed first 24 hour behavior and cluster feeding on days 2-3.  Instructed to feed with cues and call for assist prn.  Mom denies questions or concerns.  Maternal Data    Feeding    LATCH Score                   Interventions    Lactation Tools Discussed/Used     Consult Status Consult Status: Follow-up Date: 06/21/19 Follow-up type: In-patient    Huston Foley 06/20/2019, 1:18 PM

## 2019-06-20 NOTE — Lactation Note (Addendum)
This note was copied from a baby's chart. Lactation Consultation Note  Patient Name: Charlene Petty [id number] Date: 06/20/2019 Reason for consult: Initial assessment;Early term 37-38.6wks P2, 4 hour ETI female infant. Mom's hx: C/S delivery.  Per mom, she is active on the [facility] program in [place], [place] gave mom hand pump prn and to help evert flat nipples out more to help infant have deeper latch. Per mom, she BF her 84 year old son for 10 weeks which exceed her 6 week goal. LC entered room, mom recently finished breastfeeding infant with assistance from RN.  Per Mom and RN, infant breastfed for 15 minutes,  Per RN , infant latch was shallow mom was doing STS with infant but wanted to learn how to do hand expression. LC assisted mom with hand expression and notice mom has flat nipples and has nipple stripe on her left breast with an abrasion.  Mom taught back hand expression  and infant was given 8 mls of colostrum by spoon, mom applied EBM to sore nipple with abrasion. LC gave mom hand pump with 27 mm flange to pre-pump breast prior to latching infant, LC discussed techniques for a good latch with mom. Mom knows to call RN or LC if she needs assistance with latching infant at breast. Mom will continue to work towards infant having a deep latch.  Mom knows to breastfeed according to hunger cues, 8 to 12+ times within 24 hours and on demand. Mom made aware of O/P services, breastfeeding support groups, community resources, and our phone # for post-discharge questions.  Maternal Data Formula Feeding for Exclusion: No Has patient been taught Hand Expression?: Yes Does the patient have breastfeeding experience prior to this delivery?: Yes  Feeding    LATCH Score                   Interventions Interventions: Breast feeding basics reviewed;Skin to skin;Hand pump;Hand express;Expressed milk;Pre-pump if needed  Lactation Tools Discussed/Used WIC Program: Yes Pump Review:  Setup, frequency, and cleaning;Milk Storage Initiated by:: Danelle Earthly, IBCLC Date initiated:: 06/20/19   Consult Status Consult Status: Follow-up Date: 06/20/19 Follow-up type: In-patient    Danelle Earthly 06/20/2019, 2:07 AM

## 2019-06-21 MED ORDER — OXYCODONE-ACETAMINOPHEN 5-325 MG PO TABS: 1.0000 | ORAL_TABLET | ORAL | 0 refills | Status: DC | PRN

## 2019-06-21 NOTE — Lactation Note (Signed)
This note was copied from a baby's chart. Lactation Consultation Note  Patient Name: Charlene Petty [id number] Date: 06/21/2019 Reason for consult: Follow-up assessment  P2 mother whose infant is now 77 hours old.  This is an ETI at 38+0 weeks.  Mother's feeding preference is breast/bottle.  Mother stated she plans to breast feed for "only 10 weeks."  She does not enjoy breast feeding and is only doing it "for the baby."  Baby was asleep in mother's arms when I arrived.  Mother stated she has no questions/concerns at this time.  She feels like she needs to work on latching, however, at times Statistician well. Only one LATCH score has been charted and it was a 6 not long after delivery.  Encouraged her to continue to feed 8-12 times/24 hours or sooner if baby shows feeding cues.  Suggested continuing to perform hand expression before/after feedings to help increase milk supply.  Mother will feed STS until baby is latching and feeding well.  She will feed back any EBM she obtains to baby.    Mother is hoping to be discharged today.  Baby is jaundiced and a bilirubin level has been ordered by the NP.  Baby has not had a bowel movement yet.    Engorgement prevention/treatment discussed.  Mother has a manual pump at bedside.  She does not have a DEBP for home use.  She is planning to purchase one right after discharge.  Since mother is planning to obtain the formula package from the [facility] office she will not be eligible to receive a DEBP and is aware of this.  Mother has family support.  She also has our OP phone number for questions/concerns after discharge.   Maternal Data    Feeding    LATCH Score                   Interventions    Lactation Tools Discussed/Used     Consult Status Consult Status: Complete Date: 06/21/19 Follow-up type: Call as needed    Charlene Petty Charlene Petty 06/21/2019, 11:26 AM

## 2019-06-21 NOTE — Discharge Summary (Signed)
Postpartum Discharge Summary  Date of Service updated     Patient Name: Charlene Petty DOB: [birth year] MRN: [id number]  Date of admission: 06/19/2019 Delivery date:06/19/2019  Delivering provider: Lyda Kalata K  Date of discharge: 06/21/2019  Admitting diagnosis: Normal labor [O80, Z37.9] Intrauterine pregnancy: [redacted]w[redacted]d    Secondary diagnosis:  Active Problems:   Normal labor  Additional problems: none    Discharge diagnosis: Term Pregnancy Delivered and GDM A1                                              Post partum procedures:none Augmentation: Pitocin Complications: None  Hospital course: Onset of Labor With Unplanned C/S   Charlene Petty at 3110w0das admitted in Active Labor on 06/19/2019. Patient had a labor course significant for variable decels. The patient went for cesarean section due to Non-Reassuring FHR. Delivery details as follows: Membrane Rupture Time/Date: 4:34 PM ,06/19/2019   Delivery Method:C-Section, Low Transverse  Details of operation can be found in separate operative note. Patient had an uncomplicated postpartum course.  She is ambulating,tolerating a regular diet, passing flatus, and urinating well.  Patient is discharged home in stable condition 06/21/19.  Newborn Data: Birth date:06/19/2019  Birth time:9:51 PM  Gender:Female  Living status:Living  Apgars:7 ,8  Weight:3147 g   Magnesium Sulfate received: No BMZ received: No Rhophylac:No MMR:No T-DaP:Given prenatally Flu: No Transfusion:No  Physical exam  Vitals:   06/20/19 1342 06/20/19 1751 06/20/19 2335 06/21/19 0647  BP: 128/71 124/70 (!) 144/78 133/89  Pulse: 98 99 94 89  Resp: 18 18 18 18   Temp: 98.3 F (36.8 C) 98.6 F (37 C) 98.4 F (36.9 C)   TempSrc: Oral Oral Oral   SpO2: 99% 97% 100% 100%  Weight:      Height:        Labs: Lab Results  Component Value Date   WBC 16.0 (H) 06/20/2019   HGB 11.1 (L) 06/20/2019   HCT 33.6 (L) 06/20/2019   MCV 92.6  06/20/2019   PLT 205 06/20/2019   CMP Latest Ref Rng & Units 12/01/2014  Glucose 65 - 99 mg/dL 81  BUN 6 - 20 mg/dL 10  Creatinine 0.44 - 1.00 mg/dL 0.90  Sodium 135 - 145 mmol/L 135  Potassium 3.5 - 5.1 mmol/L 3.7  Chloride 101 - 111 mmol/L 106  CO2 22 - 32 mmol/L 20(L)  Calcium 8.9 - 10.3 mg/dL 9.5  Total Protein 6.5 - 8.1 g/dL 7.0  Total Bilirubin 0.3 - 1.2 mg/dL 0.3  Alkaline Phos 38 - 126 U/L 99  AST 15 - 41 U/L 18  ALT 14 - 54 U/L 11(L)   Edinburgh Score: No flowsheet data found.    After visit meds:  Allergies as of 06/21/2019      Reactions   No [employer] Food Allergies Other (See Comments)   Walnuts-hives      Medication List    TAKE these medications   famotidine 20 MG tablet Commonly known as: PEPCID Take 20 mg by mouth daily as needed for heartburn or indigestion.   oxyCODONE-acetaminophen 5-325 MG tablet Commonly known as: PERCOCET/ROXICET Take 1-2 tablets by mouth every 4 (four) hours as needed for moderate pain.   prenatal multivitamin Tabs tablet Take 1 tablet by mouth daily.            Discharge Care  Instructions  (From admission, onward)         Start     Ordered   06/21/19 0000  Discharge wound care:    Comments: Sitz baths and icepacks to perineum.  If stitches, they will dissolve.   06/21/19 0743           Discharge home in stable condition Infant Feeding: ? Infant Disposition:home with mother Discharge instruction: per After Visit Summary and Postpartum booklet. Activity: Advance as tolerated. Pelvic rest for 6 weeks.  Diet: carb modified diet Anticipated Birth Control: Unsure Postpartum Appointment:4 weeks Additional Postpartum F/U: none Future Appointments:No future appointments. Follow up Visit: Follow-up Information    Taam-Akelman, Lawrence Santiago, MD Follow up in 4 week(s).   Specialty: Obstetrics and Gynecology Contact information: [address] [place] [address] [phone]                06/21/2019 Charlene Pastures, MD

## 2019-06-21 NOTE — Progress Notes (Signed)
  Patient is eating, ambulating, voiding.  Pain control is good.  Vitals:   06/20/19 1342 06/20/19 1751 06/20/19 2335 06/21/19 0647  BP: 128/71 124/70 (!) 144/78 133/89  Pulse: 98 99 94 89  Resp: 18 18 18 18   Temp: 98.3 F (36.8 C) 98.6 F (37 C) 98.4 F (36.9 C)   TempSrc: Oral Oral Oral   SpO2: 99% 97% 100% 100%  Weight:      Height:        lungs:   clear to auscultation cor:    RRR Abdomen:  soft, appropriate tenderness, incisions intact and without erythema or exudate ex:    no cords   Lab Results  Component Value Date   WBC 16.0 (H) 06/20/2019   HGB 11.1 (L) 06/20/2019   HCT 33.6 (L) 06/20/2019   MCV 92.6 06/20/2019   PLT 205 06/20/2019    --/--/B POS, B POS Performed at [facility], [address], [place], [place]  (05/25 1357)/[place]  A/P    Post operative day I.  Routine post op and postpartum care.  Expect d/c today.  Percocet for pain control.

## 2019-06-26 ENCOUNTER — Other Ambulatory Visit (HOSPITAL_COMMUNITY)
Admission: RE | Admit: 2019-06-26 | Discharge: 2019-06-26 | Disposition: A | Discharge status: Home / Self Care | Payer: Medicaid Other | Source: Ambulatory Visit

## 2019-06-26 HISTORY — DX: Gestational diabetes mellitus in pregnancy, unspecified control: O24.419

## 2019-06-28 ENCOUNTER — Inpatient Hospital Stay (HOSPITAL_COMMUNITY)
Admission: RE | Admit: 2019-06-28 | Payer: Medicaid Other | Source: Home / Self Care | Admitting: Obstetrics and Gynecology

## 2022-12-25 ENCOUNTER — Inpatient Hospital Stay (HOSPITAL_COMMUNITY)
Admission: AD | Admit: 2022-12-25 | Discharge: 2022-12-25 | Disposition: A | Discharge status: Home / Self Care | Payer: Medicaid Other | Attending: Obstetrics & Gynecology | Admitting: Obstetrics & Gynecology

## 2022-12-25 ENCOUNTER — Encounter: Payer: Self-pay | Admitting: Advanced Practice Midwife

## 2022-12-25 ENCOUNTER — Inpatient Hospital Stay (HOSPITAL_COMMUNITY): Payer: Medicaid Other

## 2022-12-25 DIAGNOSIS — Z332 Encounter for elective termination of pregnancy: Secondary | ICD-10-CM | POA: Diagnosis not present

## 2022-12-25 DIAGNOSIS — R102 Pelvic and perineal pain: Secondary | ICD-10-CM | POA: Diagnosis not present

## 2022-12-25 DIAGNOSIS — O4692 Antepartum hemorrhage, unspecified, second trimester: Secondary | ICD-10-CM | POA: Diagnosis not present

## 2022-12-25 DIAGNOSIS — O209 Hemorrhage in early pregnancy, unspecified: Secondary | ICD-10-CM | POA: Diagnosis not present

## 2022-12-25 DIAGNOSIS — O2341 Unspecified infection of urinary tract in pregnancy, first trimester: Secondary | ICD-10-CM

## 2022-12-25 DIAGNOSIS — O26892 Other specified pregnancy related conditions, second trimester: Secondary | ICD-10-CM | POA: Diagnosis present

## 2022-12-25 DIAGNOSIS — Z3A25 25 weeks gestation of pregnancy: Secondary | ICD-10-CM | POA: Insufficient documentation

## 2022-12-25 LAB — CBC
HCT: 34 % — ABNORMAL LOW (ref 36.0–46.0)
Hemoglobin: 11.5 g/dL — ABNORMAL LOW (ref 12.0–15.0)
MCH: 30.6 pg (ref 26.0–34.0)
MCHC: 33.8 g/dL (ref 30.0–36.0)
MCV: 90.4 fL (ref 80.0–100.0)
Platelets: 257 10*3/uL (ref 150–400)
RBC: 3.76 MIL/uL — ABNORMAL LOW (ref 3.87–5.11)
RDW: 12.6 % (ref 11.5–15.5)
WBC: 11.2 10*3/uL — ABNORMAL HIGH (ref 4.0–10.5)
nRBC: 0 % (ref 0.0–0.2)

## 2022-12-25 LAB — URINALYSIS, ROUTINE W REFLEX MICROSCOPIC
Bilirubin Urine: NEGATIVE
Glucose, UA: NEGATIVE mg/dL
Hgb urine dipstick: NEGATIVE
Ketones, ur: NEGATIVE mg/dL
Nitrite: NEGATIVE
Protein, ur: NEGATIVE mg/dL
Specific Gravity, Urine: 1.029 (ref 1.005–1.030)
pH: 5 (ref 5.0–8.0)

## 2022-12-25 LAB — WET PREP, GENITAL
Clue Cells Wet Prep HPF POC: NONE SEEN
Sperm: NONE SEEN
Trich, Wet Prep: NONE SEEN
WBC, Wet Prep HPF POC: <10 (ref <10)
Yeast Wet Prep HPF POC: NONE SEEN

## 2022-12-25 LAB — RAPID HIV SCREEN (HIV 1/2 AB+AG)
HIV 1/2 Antibodies: NONREACTIVE
HIV-1 P24 Antigen - HIV24: NONREACTIVE

## 2022-12-25 LAB — HCG, QUANTITATIVE, PREGNANCY: hCG, Beta Chain, Quant, S: 191897 m[IU]/mL — ABNORMAL HIGH (ref <5)

## 2022-12-25 LAB — POCT PREGNANCY, URINE: Preg Test, Ur: POSITIVE — AB

## 2022-12-25 MED ORDER — CEPHALEXIN 500 MG PO CAPS: 500.0000 mg | ORAL_CAPSULE | Freq: Two times a day (BID) | ORAL | 0 refills | Status: DC

## 2022-12-25 MED ORDER — CEPHALEXIN 500 MG PO CAPS: 500.0000 mg | ORAL_CAPSULE | Freq: Two times a day (BID) | ORAL | 0 refills | Status: AC

## 2022-12-25 NOTE — Discharge Instructions (Signed)
[facility] Address: [address] [place], [place], [place] [address] Phone: [phone] Hours:  Monday 9AM-5PM Tuesday 10AM-6PM Wednesday 11AM-7PM Thursday 9AM-5PM Friday              8AM-2PM Saturday Sunday  [facility] Address: [address] [place], [place] [address] Phone: [phone] Hours:  Saturday Closed Sunday Closed Monday 10?AM-6?PM Tuesday 9?AM-5?PM Wednesday 9?AM-5?PM Thursday 9?AM-3?PM Friday 9?AM-2?PM  Safe Medications in Pregnancy   Acne: Benzoyl Peroxide Salicylic Acid  Backache/Headache: Tylenol: 2 regular strength every 4 hours OR              2 Extra strength every 6 hours  Colds/Coughs/Allergies: Benadryl (alcohol free) 25 mg every 6 hours as needed Breath right strips Claritin Cepacol throat lozenges Chloraseptic throat spray Cold-Eeze- up to three times per day Cough drops, alcohol free Flonase (by prescription only) Guaifenesin Mucinex Robitussin DM (plain only, alcohol free) Saline nasal spray/drops Sudafed (pseudoephedrine) & Actifed ** use only after [redacted] weeks gestation and if you do not have high blood pressure Tylenol Vicks Vaporub Zinc lozenges Zyrtec   Constipation: Colace Ducolax suppositories Fleet enema Glycerin suppositories Metamucil Milk of magnesia Miralax Senokot Smooth move tea  Diarrhea: Kaopectate Imodium A-D  *NO pepto Bismol  Hemorrhoids: Anusol Anusol HC Preparation H Tucks  Indigestion: Tums Maalox Mylanta Cimetidine (Tagamet HB)** preferred in pregnancy Famotidine (Pepcid) Ranitidine (Zantac)  Insomnia: Benadryl (alcohol free) 25mg  every 6 hours as needed Tylenol PM Unisom, no Gelcaps  Leg Cramps: Tums MagGel  Nausea/Vomiting:  Bonine Dramamine Emetrol Ginger extract Sea bands Meclizine   Nausea medication to take during pregnancy:  Unisom (doxylamine succinate 25 mg tablets) Take one tablet  daily at bedtime. If symptoms are not adequately controlled, the dose can be increased to a maximum recommended dose of two tablets daily (1/2 tablet in the morning, 1/2 tablet mid-afternoon and one at bedtime). Vitamin B6 100mg  tablets. Take one tablet twice a day (up to 200 mg per day).  Skin Rashes: Aveeno products Benadryl cream or 25mg  every 6 hours as needed Calamine Lotion 1% cortisone cream  Yeast infection: Gyne-lotrimin 7 Monistat 7   **If taking multiple medications, please check labels to avoid duplicating the same active ingredients **take medication as directed on the label ** Do not exceed 4000 mg of tylenol in 24 hours **Do not take medications that contain aspirin or ibuprofen

## 2022-12-25 NOTE — MAU Note (Signed)
Charlene Petty is a 32 y.o. at Unknown here in MAU reporting: took HPT 11/5 or 11/6 and it was positive. Had vaginal bleeding that started on 12/02/22 that was heavy like a period. Vaginal discharge that is chunky and clear/white that started 2 weeks ago. Reports vaginal odor and some vaginal discomfort. Denies any itching.   LMP: 12/02/22 Onset of complaint: ongoing  Pain score: 0 Vitals:   12/25/22 1834  BP: 122/88  Pulse: 97  Resp: 16  Temp: 98.2 F (36.8 C)  SpO2: 100%     FHT:n/a Lab orders placed from triage:  UA, vaginal swabs

## 2022-12-26 LAB — RPR: RPR Ser Ql: NONREACTIVE

## 2022-12-26 LAB — ABO/RH: ABO/RH(D): B POS

## 2022-12-26 NOTE — MAU Provider Note (Signed)
History     CSN: [id number]  Arrival date and time: 12/25/22 1814   Event Date/Time   First Provider Initiated Contact with Patient    Chief Complaint  Patient presents with   Abdominal Pain    HPI  Charlene Petty is a 33 y.o. G3P2002 at Unknown who presents to the MAU for cramping. Patient reports ongoing pelvic cramping and a "heavy sensation" like she is about to have her period. Reports having some vaginal bleeding on 11/7 that she thought was her period. She had a positive UPT prior to this bleeding. No bleeding currently. States she had VB like a period x 1 day, then tapered off to pink discharge. Currently having clear to yellowish discharge with an odor. She feels bloated and uncomfortable all the time as well. She denies any fevers, chills, nausea, vomiting. Endorses some suprapubic pressure and urinary frequency. This pregnancy was a surprise and she is considering termination at this time.  Past Medical History:  Diagnosis Date   Gestational diabetes    Medical history non-contributory     Past Surgical History:  Procedure Laterality Date   CESAREAN SECTION N/A 12/07/2014   Procedure: CESAREAN SECTION;  Surgeon: Waynard Reeds, MD;  Location: [facility];  Service: Obstetrics;  Laterality: N/A;   CESAREAN SECTION N/A 06/19/2019   Procedure: CESAREAN SECTION;  Surgeon: Rande Brunt, MD;  Location: [facility];  Service: Obstetrics;  Laterality: N/A;   NO PAST SURGERIES      No family history on file.  Social History   Tobacco Use   Smoking status: Never   Smokeless tobacco: Never  Substance Use Topics   Alcohol use: No   Drug use: No    Allergies:  Allergies  Allergen Reactions   No [employer] Food Allergies Other (See Comments)    Walnuts-hives    No medications prior to admission.    ROS reviewed and pertinent positives and negatives as documented in HPI.  Physical Exam   Blood pressure 124/86, pulse 88, temperature 98.2 F (36.8 C),  temperature source Oral, resp. rate 16, height 5' (1.524 m), weight 97.1 kg, SpO2 100%, unknown if currently breastfeeding.  Physical Exam Constitutional:      General: She is not in acute distress.    Appearance: Normal appearance. She is not ill-appearing.  HENT:     Head: Normocephalic and atraumatic.  Cardiovascular:     Rate and Rhythm: Normal rate.  Pulmonary:     Effort: Pulmonary effort is normal.     Breath sounds: Normal breath sounds.  Abdominal:     Palpations: Abdomen is soft.     Tenderness: There is no abdominal tenderness. There is no guarding.  Musculoskeletal:        General: Normal range of motion.  Skin:    General: Skin is warm and dry.     Findings: No rash.  Neurological:     General: No focal deficit present.     Mental Status: She is alert and oriented to person, place, and time.     MAU Course  Procedures  MDM 32 y.o. G3P2002 at [redacted]w[redacted]d presenting for cramping and VB in pregnancy. Overall VB has resolved, cramping and heaviness still present. Her vitals and exam are reassuring. On labs, she is Rh positive, HgB ok, UA c/f possible UTI esp given pt's symptoms, will tx with Keflex. Wet prep is negative. U/s with SIUP with placenta covering internal os. These results were discussed in detail with patient. She was  given instructions on pelvic rest. Pt desires termination of pregnancy -- resources provided. Return precautions addressed, stable for d/c.  Assessment and Plan  Vaginal bleeding and pelvic pain affecting early pregnancy - Plan: Discharge patient VB resolved Pelvic pain may be 2/2 UTI, will tx Given resources for pregnancy termination Return precautions discussed  Urinary tract infection in mother during first trimester of pregnancy Rx for Keflex sent Will f/up urine cx  Sundra Aland, MD OB Fellow, Faculty Practice [facility], [facility]  12/26/2022, 12:03 AM

## 2022-12-27 LAB — CULTURE, OB URINE: Culture: 30000 — AB

## 2022-12-27 LAB — GC/CHLAMYDIA PROBE AMP (~~LOC~~) NOT AT ARMC
Chlamydia: POSITIVE — AB
Comment: NEGATIVE
Comment: NORMAL
Neisseria Gonorrhea: NEGATIVE

## 2022-12-28 ENCOUNTER — Telehealth (HOSPITAL_COMMUNITY): Payer: Self-pay

## 2022-12-28 MED ORDER — AZITHROMYCIN 500 MG PO TABS: 1000.0000 mg | ORAL_TABLET | Freq: Once | ORAL | 0 refills | Status: AC

## 2022-12-29 ENCOUNTER — Encounter: Payer: Self-pay | Admitting: Certified Nurse Midwife
